# Patient Record
Sex: Female | Born: 1945 | Race: White | Hispanic: No | Smoking: Never smoker
Health system: Southern US, Community
[De-identification: ages and names within clinical notes are randomized; demographics above are authoritative.]

## PROBLEM LIST (undated history)

## (undated) DIAGNOSIS — S92909A Unspecified fracture of unspecified foot, initial encounter for closed fracture: Secondary | ICD-10-CM

## (undated) DIAGNOSIS — M545 Low back pain, unspecified: Secondary | ICD-10-CM

## (undated) DIAGNOSIS — R071 Chest pain on breathing: Secondary | ICD-10-CM

## (undated) DIAGNOSIS — H269 Unspecified cataract: Secondary | ICD-10-CM

## (undated) DIAGNOSIS — K219 Gastro-esophageal reflux disease without esophagitis: Secondary | ICD-10-CM

## (undated) DIAGNOSIS — M1990 Unspecified osteoarthritis, unspecified site: Secondary | ICD-10-CM

## (undated) DIAGNOSIS — N3289 Other specified disorders of bladder: Secondary | ICD-10-CM

## (undated) DIAGNOSIS — L659 Nonscarring hair loss, unspecified: Secondary | ICD-10-CM

## (undated) DIAGNOSIS — F4321 Adjustment disorder with depressed mood: Secondary | ICD-10-CM

## (undated) DIAGNOSIS — L719 Rosacea, unspecified: Secondary | ICD-10-CM

## (undated) DIAGNOSIS — M7751 Other enthesopathy of right foot: Secondary | ICD-10-CM

## (undated) DIAGNOSIS — G8929 Other chronic pain: Secondary | ICD-10-CM

## (undated) DIAGNOSIS — J209 Acute bronchitis, unspecified: Secondary | ICD-10-CM

## (undated) DIAGNOSIS — Z8679 Personal history of other diseases of the circulatory system: Secondary | ICD-10-CM

## (undated) DIAGNOSIS — T7840XA Allergy, unspecified, initial encounter: Secondary | ICD-10-CM

## (undated) DIAGNOSIS — I10 Essential (primary) hypertension: Secondary | ICD-10-CM

## (undated) DIAGNOSIS — R51 Headache: Secondary | ICD-10-CM

## (undated) HISTORY — DX: Unspecified cataract: H26.9

## (undated) HISTORY — PX: TUBAL LIGATION: SHX77

## (undated) HISTORY — DX: Low back pain, unspecified: M54.50

## (undated) HISTORY — DX: Adjustment disorder with depressed mood: F43.21

## (undated) HISTORY — PX: CATARACT EXTRACTION, BILATERAL: SHX1313

## (undated) HISTORY — DX: Gastro-esophageal reflux disease without esophagitis: K21.9

## (undated) HISTORY — PX: COLONOSCOPY: SHX174

## (undated) HISTORY — DX: Unspecified fracture of unspecified foot, initial encounter for closed fracture: S92.909A

## (undated) HISTORY — PX: OTHER SURGICAL HISTORY: SHX169

## (undated) HISTORY — DX: Acute bronchitis, unspecified: J20.9

## (undated) HISTORY — PX: FRONTALIS SUSPENSION: SHX1688

## (undated) HISTORY — PX: ABDOMINAL HYSTERECTOMY: SHX81

## (undated) HISTORY — DX: Headache: R51

## (undated) HISTORY — DX: Essential (primary) hypertension: I10

## (undated) HISTORY — DX: Other enthesopathy of right foot and ankle: M77.51

## (undated) HISTORY — DX: Rosacea, unspecified: L71.9

## (undated) HISTORY — DX: Chest pain on breathing: R07.1

## (undated) HISTORY — DX: Personal history of other diseases of the circulatory system: Z86.79

## (undated) HISTORY — PX: JOINT REPLACEMENT: SHX530

## (undated) HISTORY — DX: Allergy, unspecified, initial encounter: T78.40XA

## (undated) HISTORY — DX: Nonscarring hair loss, unspecified: L65.9

## (undated) HISTORY — DX: Other specified disorders of bladder: N32.89

## (undated) HISTORY — PX: EYE SURGERY: SHX253

## (undated) HISTORY — DX: Other chronic pain: G89.29

## (undated) HISTORY — DX: Unspecified osteoarthritis, unspecified site: M19.90

---

## 1998-09-13 ENCOUNTER — Other Ambulatory Visit: Admission: RE | Admit: 1998-09-13 | Discharge: 1998-09-13 | Payer: Self-pay | Admitting: Obstetrics and Gynecology

## 1998-09-15 ENCOUNTER — Ambulatory Visit (HOSPITAL_COMMUNITY): Admission: RE | Admit: 1998-09-15 | Discharge: 1998-09-15 | Payer: Self-pay | Admitting: Gastroenterology

## 2000-07-10 ENCOUNTER — Other Ambulatory Visit: Admission: RE | Admit: 2000-07-10 | Discharge: 2000-07-10 | Payer: Self-pay | Admitting: Obstetrics and Gynecology

## 2001-11-12 ENCOUNTER — Encounter (INDEPENDENT_AMBULATORY_CARE_PROVIDER_SITE_OTHER): Payer: Self-pay | Admitting: Specialist

## 2001-11-12 ENCOUNTER — Ambulatory Visit (HOSPITAL_BASED_OUTPATIENT_CLINIC_OR_DEPARTMENT_OTHER): Admission: RE | Admit: 2001-11-12 | Discharge: 2001-11-12 | Payer: Self-pay | Admitting: Plastic Surgery

## 2004-09-15 ENCOUNTER — Encounter: Payer: Self-pay | Admitting: Internal Medicine

## 2005-09-06 ENCOUNTER — Ambulatory Visit: Payer: Self-pay | Admitting: Internal Medicine

## 2005-09-12 ENCOUNTER — Ambulatory Visit: Payer: Self-pay | Admitting: Internal Medicine

## 2006-03-14 ENCOUNTER — Ambulatory Visit: Payer: Self-pay | Admitting: Internal Medicine

## 2006-05-25 ENCOUNTER — Ambulatory Visit: Payer: Self-pay | Admitting: Internal Medicine

## 2006-10-18 ENCOUNTER — Ambulatory Visit: Payer: Self-pay | Admitting: Internal Medicine

## 2006-10-18 LAB — CONVERTED CEMR LAB
ALT: 45 units/L — ABNORMAL HIGH (ref 0–40)
CO2: 29 meq/L (ref 19–32)
Chloride: 104 meq/L (ref 96–112)
Creatinine, Ser: 0.9 mg/dL (ref 0.4–1.2)
GFR calc non Af Amer: 68 mL/min
Glomerular Filtration Rate, Af Am: 83 mL/min/{1.73_m2}
Glucose, Bld: 100 mg/dL — ABNORMAL HIGH (ref 70–99)
HDL: 54.9 mg/dL (ref >39.0)
LDL Cholesterol: 102 mg/dL — ABNORMAL HIGH (ref 0–99)
MCHC: 33.7 g/dL (ref 30.0–36.0)
MCV: 90.9 fL (ref 78.0–100.0)
Monocytes Absolute: 1 10*3/uL — ABNORMAL HIGH (ref 0.2–0.7)
Monocytes Relative: 10.9 % (ref 3.0–11.0)
Neutro Abs: 3.9 10*3/uL (ref 1.4–7.7)
Platelets: 230 10*3/uL (ref 150–400)
RBC: 4.76 M/uL (ref 3.87–5.11)
Sodium: 140 meq/L (ref 135–145)
Triglyceride fasting, serum: 69 mg/dL (ref 0–149)
VLDL: 14 mg/dL (ref 0–40)

## 2006-10-25 ENCOUNTER — Ambulatory Visit: Payer: Self-pay | Admitting: Internal Medicine

## 2006-12-28 ENCOUNTER — Ambulatory Visit: Payer: Self-pay | Admitting: Gastroenterology

## 2007-07-08 ENCOUNTER — Ambulatory Visit: Payer: Self-pay | Admitting: Internal Medicine

## 2007-07-08 DIAGNOSIS — R519 Headache, unspecified: Secondary | ICD-10-CM | POA: Insufficient documentation

## 2007-07-08 DIAGNOSIS — N3289 Other specified disorders of bladder: Secondary | ICD-10-CM | POA: Insufficient documentation

## 2007-07-08 DIAGNOSIS — R51 Headache: Secondary | ICD-10-CM | POA: Insufficient documentation

## 2007-07-08 DIAGNOSIS — K219 Gastro-esophageal reflux disease without esophagitis: Secondary | ICD-10-CM | POA: Insufficient documentation

## 2007-07-08 DIAGNOSIS — I059 Rheumatic mitral valve disease, unspecified: Secondary | ICD-10-CM | POA: Insufficient documentation

## 2007-07-08 DIAGNOSIS — J309 Allergic rhinitis, unspecified: Secondary | ICD-10-CM | POA: Insufficient documentation

## 2007-07-08 DIAGNOSIS — L719 Rosacea, unspecified: Secondary | ICD-10-CM | POA: Insufficient documentation

## 2007-09-26 ENCOUNTER — Telehealth: Payer: Self-pay | Admitting: *Deleted

## 2007-10-14 ENCOUNTER — Ambulatory Visit: Payer: Self-pay | Admitting: Internal Medicine

## 2008-03-02 ENCOUNTER — Telehealth: Payer: Self-pay | Admitting: Internal Medicine

## 2008-04-07 ENCOUNTER — Ambulatory Visit: Payer: Self-pay | Admitting: Internal Medicine

## 2008-04-07 LAB — CONVERTED CEMR LAB
ALT: 57 units/L — ABNORMAL HIGH (ref 0–35)
AST: 36 units/L (ref 0–37)
Albumin: 4.2 g/dL (ref 3.5–5.2)
Alkaline Phosphatase: 56 units/L (ref 39–117)
BUN: 18 mg/dL (ref 6–23)
Bilirubin, Direct: 0.1 mg/dL (ref 0.0–0.3)
CO2: 30 meq/L (ref 19–32)
Chloride: 106 meq/L (ref 96–112)
Eosinophils Relative: 1.2 % (ref 0.0–5.0)
GFR calc Af Amer: 94 mL/min
Glucose, Bld: 96 mg/dL (ref 70–99)
HCT: 42.7 % (ref 36.0–46.0)
HDL: 53.8 mg/dL (ref >39.0)
Hemoglobin: 14.2 g/dL (ref 12.0–15.0)
LDL Cholesterol: 110 mg/dL — ABNORMAL HIGH (ref 0–99)
Lymphocytes Relative: 43.1 % (ref 12.0–46.0)
Monocytes Absolute: 0.7 10*3/uL (ref 0.1–1.0)
Monocytes Relative: 8.8 % (ref 3.0–12.0)
Nitrite: NEGATIVE
Platelets: 211 10*3/uL (ref 150–400)
Potassium: 4.6 meq/L (ref 3.5–5.1)
Sodium: 142 meq/L (ref 135–145)
Specific Gravity, Urine: 1.025
Total CHOL/HDL Ratio: 3.3
Total Protein: 7.5 g/dL (ref 6.0–8.3)
Urobilinogen, UA: 0.2
WBC Urine, dipstick: NEGATIVE
WBC: 7.6 10*3/uL (ref 4.5–10.5)

## 2008-04-14 ENCOUNTER — Ambulatory Visit: Payer: Self-pay | Admitting: Internal Medicine

## 2008-08-17 ENCOUNTER — Telehealth: Payer: Self-pay | Admitting: Internal Medicine

## 2008-09-08 ENCOUNTER — Telehealth: Payer: Self-pay | Admitting: *Deleted

## 2008-09-11 ENCOUNTER — Encounter: Payer: Self-pay | Admitting: Internal Medicine

## 2008-10-06 ENCOUNTER — Telehealth: Payer: Self-pay | Admitting: Internal Medicine

## 2008-10-14 ENCOUNTER — Ambulatory Visit: Payer: Self-pay | Admitting: Internal Medicine

## 2008-10-14 ENCOUNTER — Ambulatory Visit: Payer: Self-pay | Admitting: Gastroenterology

## 2008-10-14 LAB — CONVERTED CEMR LAB
ALT: 37 units/L — ABNORMAL HIGH (ref 0–35)
AST: 33 units/L (ref 0–37)
Basophils Absolute: 0.1 10*3/uL (ref 0.0–0.1)
Bilirubin, Direct: 0.1 mg/dL (ref 0.0–0.3)
CO2: 31 meq/L (ref 19–32)
Chloride: 106 meq/L (ref 96–112)
Creatinine, Ser: 0.8 mg/dL (ref 0.4–1.2)
Eosinophils Absolute: 0.1 10*3/uL (ref 0.0–0.7)
GFR calc non Af Amer: 78 mL/min
Lymphocytes Relative: 42 % (ref 12.0–46.0)
MCHC: 34.6 g/dL (ref 30.0–36.0)
MCV: 91.8 fL (ref 78.0–100.0)
Neutrophils Relative %: 46.5 % (ref 43.0–77.0)
Platelets: 184 10*3/uL (ref 150–400)
RDW: 12.8 % (ref 11.5–14.6)
Sodium: 145 meq/L (ref 135–145)
TSH: 2.69 microintl units/mL (ref 0.35–5.50)
Total Bilirubin: 1 mg/dL (ref 0.3–1.2)

## 2008-10-15 ENCOUNTER — Telehealth: Payer: Self-pay | Admitting: Gastroenterology

## 2008-10-21 ENCOUNTER — Telehealth: Payer: Self-pay | Admitting: Internal Medicine

## 2008-10-27 ENCOUNTER — Ambulatory Visit: Payer: Self-pay | Admitting: Gastroenterology

## 2008-10-27 LAB — HM COLONOSCOPY

## 2008-11-27 ENCOUNTER — Ambulatory Visit: Payer: Self-pay | Admitting: Internal Medicine

## 2009-01-15 ENCOUNTER — Telehealth: Payer: Self-pay | Admitting: Internal Medicine

## 2009-02-05 ENCOUNTER — Ambulatory Visit: Payer: Self-pay | Admitting: Internal Medicine

## 2009-02-05 ENCOUNTER — Inpatient Hospital Stay (HOSPITAL_COMMUNITY): Admission: AD | Admit: 2009-02-05 | Discharge: 2009-02-09 | Payer: Self-pay | Admitting: Internal Medicine

## 2009-02-05 ENCOUNTER — Ambulatory Visit: Payer: Self-pay | Admitting: Cardiovascular Disease

## 2009-02-05 DIAGNOSIS — J209 Acute bronchitis, unspecified: Secondary | ICD-10-CM

## 2009-02-08 ENCOUNTER — Encounter: Payer: Self-pay | Admitting: Internal Medicine

## 2009-02-08 ENCOUNTER — Ambulatory Visit: Payer: Self-pay | Admitting: Cardiology

## 2009-02-09 ENCOUNTER — Other Ambulatory Visit: Payer: Self-pay | Admitting: Cardiology

## 2009-02-10 ENCOUNTER — Telehealth: Payer: Self-pay | Admitting: Internal Medicine

## 2009-02-17 ENCOUNTER — Ambulatory Visit: Payer: Self-pay | Admitting: Cardiology

## 2009-02-17 ENCOUNTER — Ambulatory Visit: Payer: Self-pay | Admitting: Internal Medicine

## 2009-02-17 ENCOUNTER — Encounter: Payer: Self-pay | Admitting: Physician Assistant

## 2009-03-18 ENCOUNTER — Telehealth: Payer: Self-pay | Admitting: Internal Medicine

## 2009-05-21 ENCOUNTER — Ambulatory Visit: Payer: Self-pay | Admitting: Internal Medicine

## 2009-07-05 ENCOUNTER — Encounter: Payer: Self-pay | Admitting: Internal Medicine

## 2009-07-05 ENCOUNTER — Telehealth: Payer: Self-pay | Admitting: Internal Medicine

## 2009-07-07 ENCOUNTER — Ambulatory Visit: Payer: Self-pay | Admitting: Internal Medicine

## 2009-07-08 ENCOUNTER — Telehealth: Payer: Self-pay | Admitting: Cardiovascular Disease

## 2009-07-08 ENCOUNTER — Ambulatory Visit: Payer: Self-pay | Admitting: Cardiology

## 2009-07-08 DIAGNOSIS — R002 Palpitations: Secondary | ICD-10-CM | POA: Insufficient documentation

## 2009-07-09 ENCOUNTER — Ambulatory Visit: Payer: Self-pay | Admitting: Internal Medicine

## 2009-07-14 ENCOUNTER — Telehealth (INDEPENDENT_AMBULATORY_CARE_PROVIDER_SITE_OTHER): Payer: Self-pay

## 2009-07-15 ENCOUNTER — Encounter: Payer: Self-pay | Admitting: Internal Medicine

## 2009-07-15 ENCOUNTER — Ambulatory Visit: Payer: Self-pay

## 2009-07-15 ENCOUNTER — Encounter: Payer: Self-pay | Admitting: Cardiology

## 2009-07-16 ENCOUNTER — Telehealth (INDEPENDENT_AMBULATORY_CARE_PROVIDER_SITE_OTHER): Payer: Self-pay | Admitting: *Deleted

## 2009-08-06 ENCOUNTER — Ambulatory Visit: Payer: Self-pay | Admitting: Internal Medicine

## 2009-08-06 ENCOUNTER — Encounter (INDEPENDENT_AMBULATORY_CARE_PROVIDER_SITE_OTHER): Payer: Self-pay | Admitting: *Deleted

## 2009-08-06 DIAGNOSIS — R0989 Other specified symptoms and signs involving the circulatory and respiratory systems: Secondary | ICD-10-CM | POA: Insufficient documentation

## 2009-08-06 LAB — CONVERTED CEMR LAB
AST: 23 units/L (ref 0–37)
Albumin: 4.1 g/dL (ref 3.5–5.2)
Alkaline Phosphatase: 48 units/L (ref 39–117)
Basophils Absolute: 0 10*3/uL (ref 0.0–0.1)
Calcium: 9.4 mg/dL (ref 8.4–10.5)
Cholesterol: 187 mg/dL (ref 0–200)
Eosinophils Absolute: 0.1 10*3/uL (ref 0.0–0.7)
GFR calc non Af Amer: 67.59 mL/min (ref >60)
Glucose, Bld: 93 mg/dL (ref 70–99)
HCT: 38.4 % (ref 36.0–46.0)
HDL: 52.2 mg/dL (ref >39.00)
Hemoglobin, Urine: NEGATIVE
LDL Cholesterol: 118 mg/dL — ABNORMAL HIGH (ref 0–99)
Lymphs Abs: 2.8 10*3/uL (ref 0.7–4.0)
MCV: 92.6 fL (ref 78.0–100.0)
Monocytes Absolute: 0.6 10*3/uL (ref 0.1–1.0)
Neutrophils Relative %: 51.9 % (ref 43.0–77.0)
Platelets: 199 10*3/uL (ref 150.0–400.0)
Potassium: 4.2 meq/L (ref 3.5–5.1)
RDW: 12.8 % (ref 11.5–14.6)
Sodium: 143 meq/L (ref 135–145)
TSH: 3.6 microintl units/mL (ref 0.35–5.50)
Total CHOL/HDL Ratio: 4
Total Protein: 7.3 g/dL (ref 6.0–8.3)
Triglycerides: 83 mg/dL (ref 0.0–149.0)
Urine Glucose: NEGATIVE mg/dL
Urobilinogen, UA: 1 (ref 0.0–1.0)
aPTT: 25.7 s (ref 21.7–28.8)

## 2009-08-11 ENCOUNTER — Ambulatory Visit (HOSPITAL_COMMUNITY): Admission: RE | Admit: 2009-08-11 | Discharge: 2009-08-12 | Payer: Self-pay | Admitting: Internal Medicine

## 2009-08-11 ENCOUNTER — Telehealth (INDEPENDENT_AMBULATORY_CARE_PROVIDER_SITE_OTHER): Payer: Self-pay | Admitting: *Deleted

## 2009-08-11 ENCOUNTER — Ambulatory Visit: Payer: Self-pay | Admitting: Internal Medicine

## 2009-08-12 ENCOUNTER — Encounter: Payer: Self-pay | Admitting: Internal Medicine

## 2009-08-25 ENCOUNTER — Ambulatory Visit: Payer: Self-pay | Admitting: Internal Medicine

## 2009-09-08 ENCOUNTER — Ambulatory Visit: Payer: Self-pay

## 2009-09-08 ENCOUNTER — Ambulatory Visit: Payer: Self-pay | Admitting: Internal Medicine

## 2009-10-20 ENCOUNTER — Telehealth: Payer: Self-pay | Admitting: Internal Medicine

## 2009-11-03 ENCOUNTER — Telehealth: Payer: Self-pay | Admitting: Internal Medicine

## 2009-11-24 ENCOUNTER — Encounter (INDEPENDENT_AMBULATORY_CARE_PROVIDER_SITE_OTHER): Payer: Self-pay | Admitting: *Deleted

## 2009-11-24 ENCOUNTER — Ambulatory Visit: Payer: Self-pay | Admitting: Internal Medicine

## 2010-02-22 ENCOUNTER — Ambulatory Visit: Payer: Self-pay | Admitting: Internal Medicine

## 2010-04-22 ENCOUNTER — Ambulatory Visit: Payer: Self-pay | Admitting: Internal Medicine

## 2010-05-27 ENCOUNTER — Ambulatory Visit: Payer: Self-pay | Admitting: Internal Medicine

## 2010-06-24 ENCOUNTER — Telehealth: Payer: Self-pay | Admitting: Internal Medicine

## 2010-09-30 ENCOUNTER — Ambulatory Visit: Payer: Self-pay | Admitting: Internal Medicine

## 2010-12-01 ENCOUNTER — Encounter: Payer: Self-pay | Admitting: Internal Medicine

## 2011-01-22 LAB — CONVERTED CEMR LAB: Cortisol, Plasma: 11.7 ug/dL

## 2011-01-26 NOTE — Assessment & Plan Note (Signed)
Summary: 3 month rov/njr   Vital Signs:  Patient profile:   65 year old female Height:      66 inches Weight:      146 pounds BMI:     23.65 Temp:     98.2 degrees F oral Pulse rate:   72 / minute Resp:     14 per minute BP sitting:   120 / 70  (left arm)  Vitals Entered By: Willy Eddy, LPN (May 27, 9146 11:27 AM) CC: roa-states she feels great!   Primary Care Provider:  [name] MD  CC:  roa-states she feels great!.  History of Present Illness: [place]! feels well. bladder responded to 2.5 mg vesicare gerd stable mood doing well and discussed pln forholiday from drug   Preventive Screening-Counseling & Management  Alcohol-Tobacco     Smoking Status: never  Problems Prior to Update: 1)  Abnormal Chest Sounds  (ICD-786.7) 2)  Atrial Fibrillation  (ICD-427.31) 3)  Paroxysmal Supraventricular Tachycardia  (ICD-427.0) 4)  Chest Wall Pain, Anterior  (ICD-786.52) 5)  Hypotension, Orthostatic  (ICD-458.0) 6)  Palpitations  (ICD-785.1) 7)  Mitral Valve Prolapse  (ICD-424.0) 8)  Bronchitis, Acute With Mild Bronchospasm  (ICD-466.0) 9)  Grief Reaction, Acute  (ICD-309.0) 10)  Preventive Health Care  (ICD-V70.0) 11)  Bladder Spasm  (ICD-596.8) 12)  Gerd  (ICD-530.81) 13)  Headache  (ICD-784.0) 14)  Rosacea  (ICD-695.3) 15)  Allergic Rhinitis  (ICD-477.9)  Current Problems (verified): 1)  Abnormal Chest Sounds  (ICD-786.7) 2)  Atrial Fibrillation  (ICD-427.31) 3)  Paroxysmal Supraventricular Tachycardia  (ICD-427.0) 4)  Chest Wall Pain, Anterior  (ICD-786.52) 5)  Hypotension, Orthostatic  (ICD-458.0) 6)  Palpitations  (ICD-785.1) 7)  Mitral Valve Prolapse  (ICD-424.0) 8)  Bronchitis, Acute With Mild Bronchospasm  (ICD-466.0) 9)  Grief Reaction, Acute  (ICD-309.0) 10)  Preventive Health Care  (ICD-V70.0) 11)  Bladder Spasm  (ICD-596.8) 12)  Gerd  (ICD-530.81) 13)  Headache  (ICD-784.0) 14)   Rosacea  (ICD-695.3) 15)  Allergic Rhinitis  (ICD-477.9)  Medications Prior to Update: 1)  Lexapro 10 Mg Tabs (Escitalopram Oxalate) .... 1/2 By Mouth Daily 2)  Xanax 0.5 Mg Tabs (Alprazolam) .... As Needed 3)  Vesicare 5 Mg Tabs (Solifenacin Succinate) .... One By Mouth Daily  Current Medications (verified): 1)  Lexapro 10 Mg Tabs (Escitalopram Oxalate) .... 1/2 By Mouth Daily 2)  Xanax 0.5 Mg Tabs (Alprazolam) .... As Needed 3)  Vesicare 5 Mg Tabs (Solifenacin Succinate) .... 1/2 By Mouth Daily  Allergies (verified): 1)  ! Reglan (Metoclopramide Hcl) 2)  ! Amoxicillin  Past History:  Family History: Last updated: October 26, 2007 father died at 42 cancer mother diesd at 29 of CHF  Social History: Last updated: 07/09/2009 Married widowed 8/09 Never Smoked Alcohol use-no Drug use-no  Risk Factors: Smoking Status: never (05/27/2010)  Past medical, surgical, family and social histories (including risk factors) reviewed, and no changes noted (except as noted below).  Past Medical History: Reviewed history from 07/09/2009 and no changes required. Current Problems:  CHEST WALL PAIN, ANTERIOR (ICD-786.52) BRONCHITIS, ACUTE WITH MILD BRONCHOSPASM (ICD-466.0) GRIEF REACTION, ACUTE (ICD-309.0) BLADDER SPASM (ICD-596.8) GERD (ICD-530.81) HEADACHE (ICD-784.0) ROSACEA (ICD-695.3) ALLERGIC RHINITIS (ICD-477.9) hx of Mitral valve prolapse, not supproted by echo  Past Surgical History: Reviewed history from 07/08/2009 and no changes required. Hysterectomy Hand tendon repair oin left hand foot surgery twice Tubal ligation  Family History: Reviewed history from 2007-10-26  and no changes required. father died at 50 cancer mother diesd at 53 of CHF  Social History: Reviewed history from 07/09/2009 and no changes required. Married widowed 8/09 Never Smoked Alcohol use-no Drug use-no  Review of Systems  The patient denies anorexia, fever, weight loss, weight gain, vision  loss, decreased hearing, hoarseness, chest pain, syncope, dyspnea on exertion, peripheral edema, prolonged cough, headaches, hemoptysis, abdominal pain, melena, hematochezia, severe indigestion/heartburn, hematuria, incontinence, genital sores, muscle weakness, suspicious skin lesions, transient blindness, difficulty walking, depression, unusual weight change, abnormal bleeding, enlarged lymph nodes, angioedema, and breast masses.    Physical Exam  General:  alert, underweight appearing, and pale.   Head:  normocephalic and atraumatic.   Eyes:  pupils equal and pupils round.   Ears:  R ear normal and L ear normal.   Nose:  no external deformity and no nasal discharge.   Mouth:  Oral mucosa and oropharynx without lesions or exudates.  Teeth in good repair. Neck:  No deformities, masses, or tenderness noted. Lungs:  normal respiratory effort and no wheezes.   Heart:  normal rate and regular rhythm.   Abdomen:  soft and non-tender.   Msk:  normal ROM and no joint tenderness.   Pulses:  R and L carotid,radial,femoral,dorsalis pedis and posterior tibial pulses are full and equal bilaterally Extremities:  No clubbing, cyanosis, edema, or deformity noted with normal full range of motion of all joints.   Neurologic:  No cranial nerve deficits noted. Station and gait are normal. Plantar reflexes are down-going bilaterally. DTRs are symmetrical throughout. Sensory, motor and coordinative functions appear intact.   Impression & Recommendations:  Problem # 1:  PALPITATIONS (ICD-785.1)  stable with out recurrance  Avoid caffeine, chocolate, and stimulants. Stress reduction as well as medication options discussed.   Problem # 2:  GRIEF REACTION, ACUTE (ICD-309.0) may consider stopping  August  Problem # 3:  BLADDER SPASM (ICD-596.8) on 1/2 vesicar  Problem # 4:  ALLERGIC RHINITIS (ICD-477.9) [place] the [place] Discussed use of allergy medications and environmental measures.   Complete  Medication List: 1)  Lexapro 10 Mg Tabs (Escitalopram oxalate) .... 1/2 by mouth daily 2)  Xanax 0.5 Mg Tabs (Alprazolam) .... As needed 3)  Vesicare 5 Mg Tabs (Solifenacin succinate) .... 1/2 by mouth daily  Patient Instructions: 1)  Please schedule a follow-up appointment in 4 months.

## 2011-01-26 NOTE — Letter (Signed)
Summary: [facility]  [facility]   Imported By: Maryln Gottron 12/07/2010 15:34:15  _____________________________________________________________________  External Attachment:    Type:   Image     Comment:   External Document

## 2011-01-26 NOTE — Assessment & Plan Note (Signed)
Summary: f68m/jml   Primary Provider:  [name] MD  CC:  6 month follow up.   pt states she has had no cardiac concerns or symptoms since last visit.  History of Present Illness: 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   She has had no recurrent sustained tachycardia palpitations. She did have one scare where her not to get a grandchild from a car her heart race but it immediately slowed down.The patient denies SOB, chest pain, edema or palpitations  She had tachycardia palpitations following her procedure. Recorder was utilized which reviewed and demonstrated only sinus rhythm.     Current Medications (verified): 1)  Lexapro 10 Mg Tabs (Escitalopram Oxalate) .... 1/2 By Mouth Daily 2)  Xanax 0.5 Mg Tabs (Alprazolam) .... As Needed 3)  Vesicare 5 Mg Tabs (Solifenacin Succinate) .... One By Mouth Daily  Allergies (verified): 1)  ! Reglan (Metoclopramide Hcl) 2)  ! Amoxicillin  Past History:  Past Medical History: Last updated: 07/09/2009 Current Problems:  CHEST WALL PAIN, ANTERIOR (ICD-786.52) BRONCHITIS, ACUTE WITH MILD BRONCHOSPASM (ICD-466.0) GRIEF REACTION, ACUTE (ICD-309.0) BLADDER SPASM (ICD-596.8) GERD (ICD-530.81) HEADACHE (ICD-784.0) ROSACEA (ICD-695.3) ALLERGIC RHINITIS (ICD-477.9) hx of Mitral valve prolapse, not supproted by echo  Past Surgical History: Last updated: 07/08/2009 Hysterectomy Hand tendon repair oin left hand foot surgery twice Tubal ligation  Family History: Last updated: 10-15-07 father died at 76 cancer mother diesd at 47 of CHF  Social History: Last updated: 07/09/2009 Married widowed 8/09 Never Smoked Alcohol use-no Drug use-no  Vital Signs:  Patient profile:   65 year old female Height:      66 inches Weight:      146 pounds BMI:     23.65 Pulse rate:   60 / minute Pulse rhythm:   regular BP sitting:   126 / 82  (left arm) Cuff size:   regular  Vitals Entered By:  Judithe Modest CMA (April 22, 2010 10:07 AM)  Physical Exam  General:  The patient was alert and oriented in no acute distress. HEENT Normal.  Neck veins were flat, carotids were brisk.  Lungs were clear.  Heart sounds were regular without murmurs or gallops.  Abdomen was soft with active bowel sounds. There is no clubbing cyanosis or edema. Skin Warm and dry    EKG  Procedure date:  04/22/2010  Findings:      Sinus rhythm at 60 Intervals 0.13007/0.43 Otherwise  Impression & Recommendations:  Problem # 1:  PAROXYSMAL SUPRAVENTRICULAR TACHYCARDIA (ICD-427.0) No recurrent SVT  :  event recorder demonstrated only sinus rhythm.  Other Orders: EKG w/ Interpretation (93000)  Patient Instructions: 1)  Your physician recommends that you schedule a follow-up appointment in: as needed.

## 2011-01-26 NOTE — Assessment & Plan Note (Signed)
Summary: 3 month rov/njr   Vital Signs:  Patient profile:   65 year old female Height:      66 inches Weight:      146 pounds BMI:     23.65 Temp:     98.1 degrees F oral Pulse rate:   72 / minute Resp:     14 per minute BP sitting:   120 / 80  (left arm)  Vitals Entered By: Willy Eddy, LPN (February 23, 7828 11:18 AM) CC: roa- states she feels great after staying at [facility] for t he winter, Abdominal Pain   Primary Care Provider:  [name] MD  CC:  roa- states she feels great after staying at [facility] for t he winter and Abdominal Pain.  History of Present Illness: Pt feels good... has been in [place] for the winter has been considering selling house in  [place] HA pattern stable with out any changees in intesity and frequency GERD stable on meds reveiewd the use of lexapro  Dyspepsia History:      There is a prior history of GERD.     Preventive Screening-Counseling & Management  Alcohol-Tobacco     Smoking Status: never  Problems Prior to Update: 1)  Abnormal Chest Sounds  (ICD-786.7) 2)  Atrial Fibrillation  (ICD-427.31) 3)  Paroxysmal Supraventricular Tachycardia  (ICD-427.0) 4)  Chest Wall Pain, Anterior  (ICD-786.52) 5)  Hypotension, Orthostatic  (ICD-458.0) 6)  Palpitations  (ICD-785.1) 7)  Mitral Valve Prolapse  (ICD-424.0) 8)  Bronchitis, Acute With Mild Bronchospasm  (ICD-466.0) 9)  Grief Reaction, Acute  (ICD-309.0) 10)  Preventive Health Care  (ICD-V70.0) 11)  Bladder Spasm  (ICD-596.8) 12)  Gerd  (ICD-530.81) 13)  Headache  (ICD-784.0) 14)  Rosacea  (ICD-695.3) 15)  Allergic Rhinitis  (ICD-477.9)  Current Problems (verified): 1)  Abnormal Chest Sounds  (ICD-786.7) 2)  Atrial Fibrillation  (ICD-427.31) 3)  Paroxysmal Supraventricular Tachycardia  (ICD-427.0) 4)  Chest Wall Pain, Anterior  (ICD-786.52) 5)  Hypotension, Orthostatic  (ICD-458.0) 6)  Palpitations  (ICD-785.1) 7)  Mitral Valve Prolapse  (ICD-424.0) 8)  Bronchitis, Acute  With Mild Bronchospasm  (ICD-466.0) 9)  Grief Reaction, Acute  (ICD-309.0) 10)  Preventive Health Care  (ICD-V70.0) 11)  Bladder Spasm  (ICD-596.8) 12)  Gerd  (ICD-530.81) 13)  Headache  (ICD-784.0) 14)  Rosacea  (ICD-695.3) 15)  Allergic Rhinitis  (ICD-477.9)  Medications Prior to Update: 1)  Lexapro 10 Mg Tabs (Escitalopram Oxalate) .Marland Kitchen.. 1 Once Daily 2)  Xanax 0.5 Mg Tabs (Alprazolam) .... As Needed 3)  Enablex 15 Mg Xr24h-Tab (Darifenacin Hydrobromide) .... One By Mouth Daily  Current Medications (verified): 1)  Lexapro 10 Mg Tabs (Escitalopram Oxalate) .... 1/2 By Mouth Daily 2)  Xanax 0.5 Mg Tabs (Alprazolam) .... As Needed 3)  Vesicare 5 Mg Tabs (Solifenacin Succinate) .... One By Mouth Daily  Allergies (verified): 1)  ! Reglan (Metoclopramide Hcl) 2)  ! Amoxicillin  Past History:  Family History: Last updated: 10-22-07 father died at 83 cancer mother diesd at 95 of CHF  Social History: Last updated: 07/09/2009 Married widowed 8/09 Never Smoked Alcohol use-no Drug use-no  Risk Factors: Smoking Status: never (02/22/2010)  Past medical, surgical, family and social histories (including risk factors) reviewed, and no changes noted (except as noted below).  Past Medical History: Reviewed history from 07/09/2009 and no changes required. Current Problems:  CHEST WALL PAIN, ANTERIOR (ICD-786.52) BRONCHITIS, ACUTE WITH MILD BRONCHOSPASM (ICD-466.0) GRIEF REACTION, ACUTE (ICD-309.0) BLADDER SPASM (ICD-596.8) GERD (  ICD-530.81) HEADACHE (ICD-784.0) ROSACEA (ICD-695.3) ALLERGIC RHINITIS (ICD-477.9) hx of Mitral valve prolapse, not supproted by echo  Past Surgical History: Reviewed history from 07/08/2009 and no changes required. Hysterectomy Hand tendon repair oin left hand foot surgery twice Tubal ligation  Family History: Reviewed history from 10/14/2007 and no changes required. father died at 73 cancer mother diesd at 46 of CHF  Social  History: Reviewed history from 07/09/2009 and no changes required. Married widowed 8/09 Never Smoked Alcohol use-no Drug use-no  Review of Systems  The patient denies anorexia, fever, weight loss, weight gain, vision loss, decreased hearing, hoarseness, chest pain, syncope, dyspnea on exertion, peripheral edema, prolonged cough, headaches, hemoptysis, abdominal pain, melena, hematochezia, severe indigestion/heartburn, hematuria, incontinence, genital sores, muscle weakness, suspicious skin lesions, transient blindness, difficulty walking, depression, unusual weight change, abnormal bleeding, enlarged lymph nodes, angioedema, and breast masses.    Physical Exam  General:  alert, underweight appearing, and pale.   Head:  normocephalic and atraumatic.   Eyes:  pupils equal and pupils round.   Ears:  R ear normal and L ear normal.   Nose:  no external deformity and no nasal discharge.   Mouth:  Oral mucosa and oropharynx without lesions or exudates.  Teeth in good repair. Neck:  No deformities, masses, or tenderness noted. Lungs:  normal respiratory effort and no wheezes.   Heart:  normal rate and regular rhythm.   Abdomen:  soft and non-tender.   Msk:  normal ROM and no joint tenderness.   Pulses:  R and L carotid,radial,femoral,dorsalis pedis and posterior tibial pulses are full and equal bilaterally Extremities:  No clubbing, cyanosis, edema, or deformity noted with normal full range of motion of all joints.   Neurologic:  No cranial nerve deficits noted. Station and gait are normal. Plantar reflexes are down-going bilaterally. DTRs are symmetrical throughout. Sensory, motor and coordinative functions appear intact. Skin:  color normal, no rashes, and decreased turgor.   Cervical Nodes:  No lymphadenopathy noted Axillary Nodes:  No palpable lymphadenopathy Psych:  Oriented X3.     Impression & Recommendations:  Problem # 1:  ATRIAL FIBRILLATION (ICD-427.31) Assessment  Improved stable rate no recurrent AF Reviewed the following: PT: 10.0 (08/06/2009)   INR: 1.0 ratio (08/06/2009)  Problem # 2:  PALPITATIONS (ICD-785.1) Assessment: Improved stable/improved  Problem # 3:  GERD (ICD-530.81) stable on OTC prn Labs Reviewed: Hgb: 13.0 (08/06/2009)   Hct: 38.4 (08/06/2009)  Problem # 4:  BLADDER SPASM (ICD-596.8) enablix... has been weanign but has increased incontinace  chang eto vesicar due to fomulary  Problem # 5:  GRIEF REACTION, ACUTE (ICD-309.0) resolved  Complete Medication List: 1)  Lexapro 10 Mg Tabs (Escitalopram oxalate) .... 1/2 by mouth daily 2)  Xanax 0.5 Mg Tabs (Alprazolam) .... As needed 3)  Vesicare 5 Mg Tabs (Solifenacin succinate) .... One by mouth daily  Patient Instructions: 1)  cut the lexpro to 1/2 a day 2)  Please schedule a follow-up appointment in 3 months. Prescriptions: VESICARE 5 MG TABS (SOLIFENACIN SUCCINATE) one by mouth daily  #30 x 11   Entered and Authorized by:   [name] MD   Signed by:   [name] MD on 02/22/2010   Method used:   Electronically to        [facility] (retail)       [address], [place]  [address]       Ph: [phone]  Fax: [phone]   RxID:   [id number]

## 2011-01-26 NOTE — Assessment & Plan Note (Signed)
Summary: 4 month rov/njr   Vital Signs:  Patient profile:   65 year old female Height:      66 inches Weight:      154 pounds BMI:     24.95 Temp:     98.2 degrees F oral Pulse rate:   72 / minute Resp:     14 per minute BP sitting:   130 / 80  (left arm)  Vitals Entered By: Willy Eddy, LPN (September 30, 2010 11:24 AM) CC: roa- pt in good mood and states she feels good, Headache, Hypertension Management Is Patient Diabetic? No   Primary Care Rahiem Schellinger:  [name] MD  CC:  roa- pt in good mood and states she feels good, Headache, and Hypertension Management.  History of Present Illness: Living  with family and doing well ready to stop antidepresant started with husband died unexpectedly decreased head ache pattern frequency with accidents persisit best treated with enablex but change to vesicare due to formulary  Headache HPI:      The patient comes in for chronic management of stable headaches.  Since the last visit, the frequency of headaches have decreased, and the intensity of the headaches have decreased.         Hypertension History:      She denies headache, chest pain, palpitations, dyspnea with exertion, orthopnea, PND, peripheral edema, visual symptoms, neurologic problems, syncope, and side effects from treatment.        Positive major cardiovascular risk factors include female age 49 years old or older and hypertension.  Negative major cardiovascular risk factors include non-tobacco-user status.          Preventive Screening-Counseling & Management  Alcohol-Tobacco     Smoking Status: never     Tobacco Counseling: not indicated; no tobacco use  Problems Prior to Update: 1)  Abnormal Chest Sounds  (ICD-786.7) 2)  Atrial Fibrillation  (ICD-427.31) 3)  Paroxysmal Supraventricular Tachycardia  (ICD-427.0) 4)  Chest Wall Pain, Anterior  (ICD-786.52) 5)  Hypotension, Orthostatic  (ICD-458.0) 6)  Palpitations  (ICD-785.1) 7)  Mitral Valve Prolapse   (ICD-424.0) 8)  Bronchitis, Acute With Mild Bronchospasm  (ICD-466.0) 9)  Grief Reaction, Acute  (ICD-309.0) 10)  Preventive Health Care  (ICD-V70.0) 11)  Bladder Spasm  (ICD-596.8) 12)  Gerd  (ICD-530.81) 13)  Headache  (ICD-784.0) 14)  Rosacea  (ICD-695.3) 15)  Allergic Rhinitis  (ICD-477.9)  Current Problems (verified): 1)  Abnormal Chest Sounds  (ICD-786.7) 2)  Atrial Fibrillation  (ICD-427.31) 3)  Paroxysmal Supraventricular Tachycardia  (ICD-427.0) 4)  Chest Wall Pain, Anterior  (ICD-786.52) 5)  Hypotension, Orthostatic  (ICD-458.0) 6)  Palpitations  (ICD-785.1) 7)  Mitral Valve Prolapse  (ICD-424.0) 8)  Bronchitis, Acute With Mild Bronchospasm  (ICD-466.0) 9)  Grief Reaction, Acute  (ICD-309.0) 10)  Preventive Health Care  (ICD-V70.0) 11)  Bladder Spasm  (ICD-596.8) 12)  Gerd  (ICD-530.81) 13)  Headache  (ICD-784.0) 14)  Rosacea  (ICD-695.3) 15)  Allergic Rhinitis  (ICD-477.9)  Medications Prior to Update: 1)  Lexapro 10 Mg Tabs (Escitalopram Oxalate) .... 1/2 By Mouth Daily 2)  Xanax 0.5 Mg Tabs (Alprazolam) .... As Needed 3)  Vesicare 5 Mg Tabs (Solifenacin Succinate) .... 1/2 By Mouth Daily  Current Medications (verified): 1)  Enablex 7.5 Mg Xr24h-Tab (Darifenacin Hydrobromide) .... One By Mouth Daily  Allergies (verified): 1)  ! Reglan (Metoclopramide Hcl) 2)  ! Amoxicillin  Past History:  Past Medical History: Last updated: 07/09/2009 Current Problems:  CHEST WALL PAIN,  ANTERIOR (ICD-786.52) BRONCHITIS, ACUTE WITH MILD BRONCHOSPASM (ICD-466.0) GRIEF REACTION, ACUTE (ICD-309.0) BLADDER SPASM (ICD-596.8) GERD (ICD-530.81) HEADACHE (ICD-784.0) ROSACEA (ICD-695.3) ALLERGIC RHINITIS (ICD-477.9) hx of Mitral valve prolapse, not supproted by echo  Past Surgical History: Last updated: 07/08/2009 Hysterectomy Hand tendon repair oin left hand foot surgery twice Tubal ligation  Family History: Last updated: 11-04-2007 father died at 47 cancer mother  diesd at 73 of CHF  Social History: Last updated: 07/09/2009 Married widowed 8/09 Never Smoked Alcohol use-no Drug use-no  Risk Factors: Smoking Status: never (09/30/2010)  Family History: Reviewed history from 11/04/2007 and no changes required. father died at 77 cancer mother diesd at 34 of CHF  Social History: Reviewed history from 07/09/2009 and no changes required. Married widowed 8/09 Never Smoked Alcohol use-no Drug use-no  Review of Systems  The patient denies anorexia, fever, weight loss, weight gain, vision loss, decreased hearing, hoarseness, chest pain, syncope, dyspnea on exertion, peripheral edema, prolonged cough, headaches, hemoptysis, abdominal pain, melena, hematochezia, severe indigestion/heartburn, hematuria, incontinence, genital sores, muscle weakness, suspicious skin lesions, transient blindness, difficulty walking, depression, unusual weight change, abnormal bleeding, enlarged lymph nodes, angioedema, and breast masses.         Flu Vaccine Consent Questions     Do you have a history of severe allergic reactions to this vaccine? no    Any prior history of allergic reactions to egg and/or gelatin? no    Do you have a sensitivity to the preservative Thimersol? no    Do you have a past history of Guillan-Barre Syndrome? no    Do you currently have an acute febrile illness? no    Have you ever had a severe reaction to latex? no    Vaccine information given and explained to patient? yes    Are you currently pregnant? no    Lot Number:[id number]   Exp Date:06/24/2011   Site Given  Left Deltoid IM   Physical Exam  General:  alert, underweight appearing, and pale.   Head:  normocephalic and atraumatic.   Eyes:  pupils equal and pupils round.   Ears:  R ear normal and L ear normal.   Nose:  no external deformity and no nasal discharge.   Mouth:  Oral mucosa and oropharynx without lesions or exudates.  Teeth in good repair. Neck:  No deformities, masses,  or tenderness noted. Lungs:  normal respiratory effort and no wheezes.   Heart:  normal rate and regular rhythm.   Abdomen:  soft and non-tender.   Msk:  normal ROM and no joint tenderness.   Extremities:  No clubbing, cyanosis, edema, or deformity noted with normal full range of motion of all joints.   Neurologic:  No cranial nerve deficits noted. Station and gait are normal. Plantar reflexes are down-going bilaterally. DTRs are symmetrical throughout. Sensory, motor and coordinative functions appear intact. Skin:  less red skin on face no rash Psych:  Oriented X3 and not depressed appearing.     Impression & Recommendations:  Problem # 1:  MITRAL VALVE PROLAPSE (ICD-424.0) Assessment Unchanged stable less palpitations, no meciation requested Echocardiogram:   Study Conclusions    1. Left ventricle: The cavity size was normal. Wall thickness was      normal. Systolic function was normal. The estimated ejection      fraction was in the range of 55% to 60%. Wall motion was normal;      there were no regional wall motion abnormalities. Doppler      parameters are consistent with  abnormal left ventricular      relaxation (grade 1 diastolic dysfunction).   2. Inferior vena cava: The vessel was normal in size; the      respirophasic diameter changes were in the normal range (= 50%);      findings are consistent with normal central venous pressure.   Impressions:    - Normal LV size and systolic function, EF 55-60%. Normal RV size     and systolic function. No significant valvular dysfunction.   Echocardiography. M-mode, complete 2D, spectral Doppler, and color   Doppler. Height: Height: 167.6cm. Height: 66in. Weight: Weight:   59.4kg. Weight: 130.7lb. Body mass index: BMI: 21.1kg/m^2. Body   surface area: BSA: 1.66m^2. Patient status: Outpatient. Location:   [place] [facility]. (07/15/2009)  Problem # 2:  BLADDER SPASM (ICD-596.8) Assessment: Deteriorated pt has not responded well to  the vesicare...this was a fomulary change willl resume the enablix and do a preauthorization is needed ( also failed detrol)  Problem # 3:  HEADACHE (ICD-784.0)  was on enablix with good result bu failed toviaz and now vesicare  Headache diary reviewed. less frequency  Problem # 4:  ROSACEA (ICD-695.3) improved  Complete Medication List: 1)  Enablex 7.5 Mg Xr24h-tab (Darifenacin hydrobromide) .... One by mouth daily  Other Orders: Admin 1st Vaccine (16109) Flu Vaccine 11yrs + [id number])  Hypertension Assessment/Plan:      The patient's hypertensive risk group is category B: At least one risk factor (excluding diabetes) with no target organ damage.  Her calculated 10 year risk of coronary heart disease is 9 %.  Today's blood pressure is 130/80.  Her blood pressure goal is < 140/90.  Patient Instructions: 1)  Please schedule a follow-up appointment in 6 months.  CPX Prescriptions: ENABLEX 7.5 MG XR24H-TAB (DARIFENACIN HYDROBROMIDE) one by mouth daily  #30 x 11   Entered and Authorized by:   [name] MD   Signed by:   [name] MD on 09/30/2010   Method used:   Print then Give to Patient   RxID:   [id number]

## 2011-01-26 NOTE — Progress Notes (Signed)
Summary: Pt req refill of [facility] to [facility] [place], [place] [address]  Phone Note Refill Request Call back at Home Phone [phone] Message from:  Patient on June 24, 2010 1:50 PM  Refills Requested: Medication #1:  LEXAPRO 10 MG TABS 1/2 by mouth daily   Dosage confirmed as above?Dosage Confirmed Pt has moved to [place] and needs refill called in [facility] [place], [place] [address]  [phone]     Method Requested: Telephone to Pharmacy Initial call taken by: Lucy Antigua,  June 24, 2010 1:52 PM    Prescriptions: LEXAPRO 10 MG TABS (ESCITALOPRAM OXALATE) 1/2 by mouth daily  #30 x 6   Entered by:   Willy Eddy, LPN   Authorized by:   [name] MD   Signed by:   Willy Eddy, LPN on 10/21/2535   Method used:   Telephoned to ...       [facility] (retail)       [address], [place]  [address]       Ph: [phone]       Fax: [phone]   RxID:   [id number]   Appended Document: Pt [facility] [place], [place] [address] [facility] [place]

## 2011-04-07 ENCOUNTER — Encounter: Payer: Self-pay | Admitting: Internal Medicine

## 2011-04-11 LAB — BASIC METABOLIC PANEL
CO2: 29 mEq/L (ref 19–32)
Calcium: 8.4 mg/dL (ref 8.4–10.5)
Chloride: 108 mEq/L (ref 96–112)
Chloride: 103 mEq/L (ref 96–112)
Creatinine, Ser: 0.83 mg/dL (ref 0.4–1.2)
Creatinine, Ser: 0.84 mg/dL (ref 0.4–1.2)
GFR calc Af Amer: >60 mL/min (ref >60)
GFR calc Af Amer: >60 mL/min (ref >60)
Glucose, Bld: 89 mg/dL (ref 70–99)

## 2011-04-11 LAB — CARDIAC PANEL(CRET KIN+CKTOT+MB+TROPI)
CK, MB: 3.1 ng/mL (ref 0.3–4.0)
CK, MB: 3.5 ng/mL (ref 0.3–4.0)
Relative Index: INVALID (ref 0.0–2.5)
Total CK: 57 U/L (ref 7–177)
Total CK: 61 U/L (ref 7–177)
Troponin I: 0.25 ng/mL — ABNORMAL HIGH (ref 0.00–0.06)

## 2011-04-11 LAB — CBC
MCV: 91.2 fL (ref 78.0–100.0)
MCV: 92.4 fL (ref 78.0–100.0)
RBC: 4.39 MIL/uL (ref 3.87–5.11)
RBC: 3.71 MIL/uL — ABNORMAL LOW (ref 3.87–5.11)
WBC: 7.7 10*3/uL (ref 4.0–10.5)
WBC: 5.6 10*3/uL (ref 4.0–10.5)

## 2011-04-11 LAB — TSH: TSH: 2.277 u[IU]/mL (ref 0.350–4.500)

## 2011-04-11 LAB — DIFFERENTIAL
Lymphs Abs: 2.5 10*3/uL (ref 0.7–4.0)
Monocytes Relative: 10 % (ref 3–12)
Neutro Abs: 4.3 10*3/uL (ref 1.7–7.7)
Neutrophils Relative %: 56 % (ref 43–77)

## 2011-04-11 LAB — PROTIME-INR
INR: 1 (ref 0.00–1.49)
Prothrombin Time: 13 seconds (ref 11.6–15.2)

## 2011-04-11 LAB — CORTISOL: Cortisol, Plasma: 19.2 ug/dL

## 2011-04-11 LAB — METANEPHRINES, URINE, 24 HOUR
Metanephrines, Ur: 68
Normetanephrine, 24H Ur: 440

## 2011-05-03 IMAGING — CR DG CHEST 2V
2 series · 2 of 2 positions shown · non-contrast
Comparison: 02/06/2009

CLINICAL DATA: Atrial fibrillation.

CHEST - 2 VIEW

[view not recorded (1 of 2)]
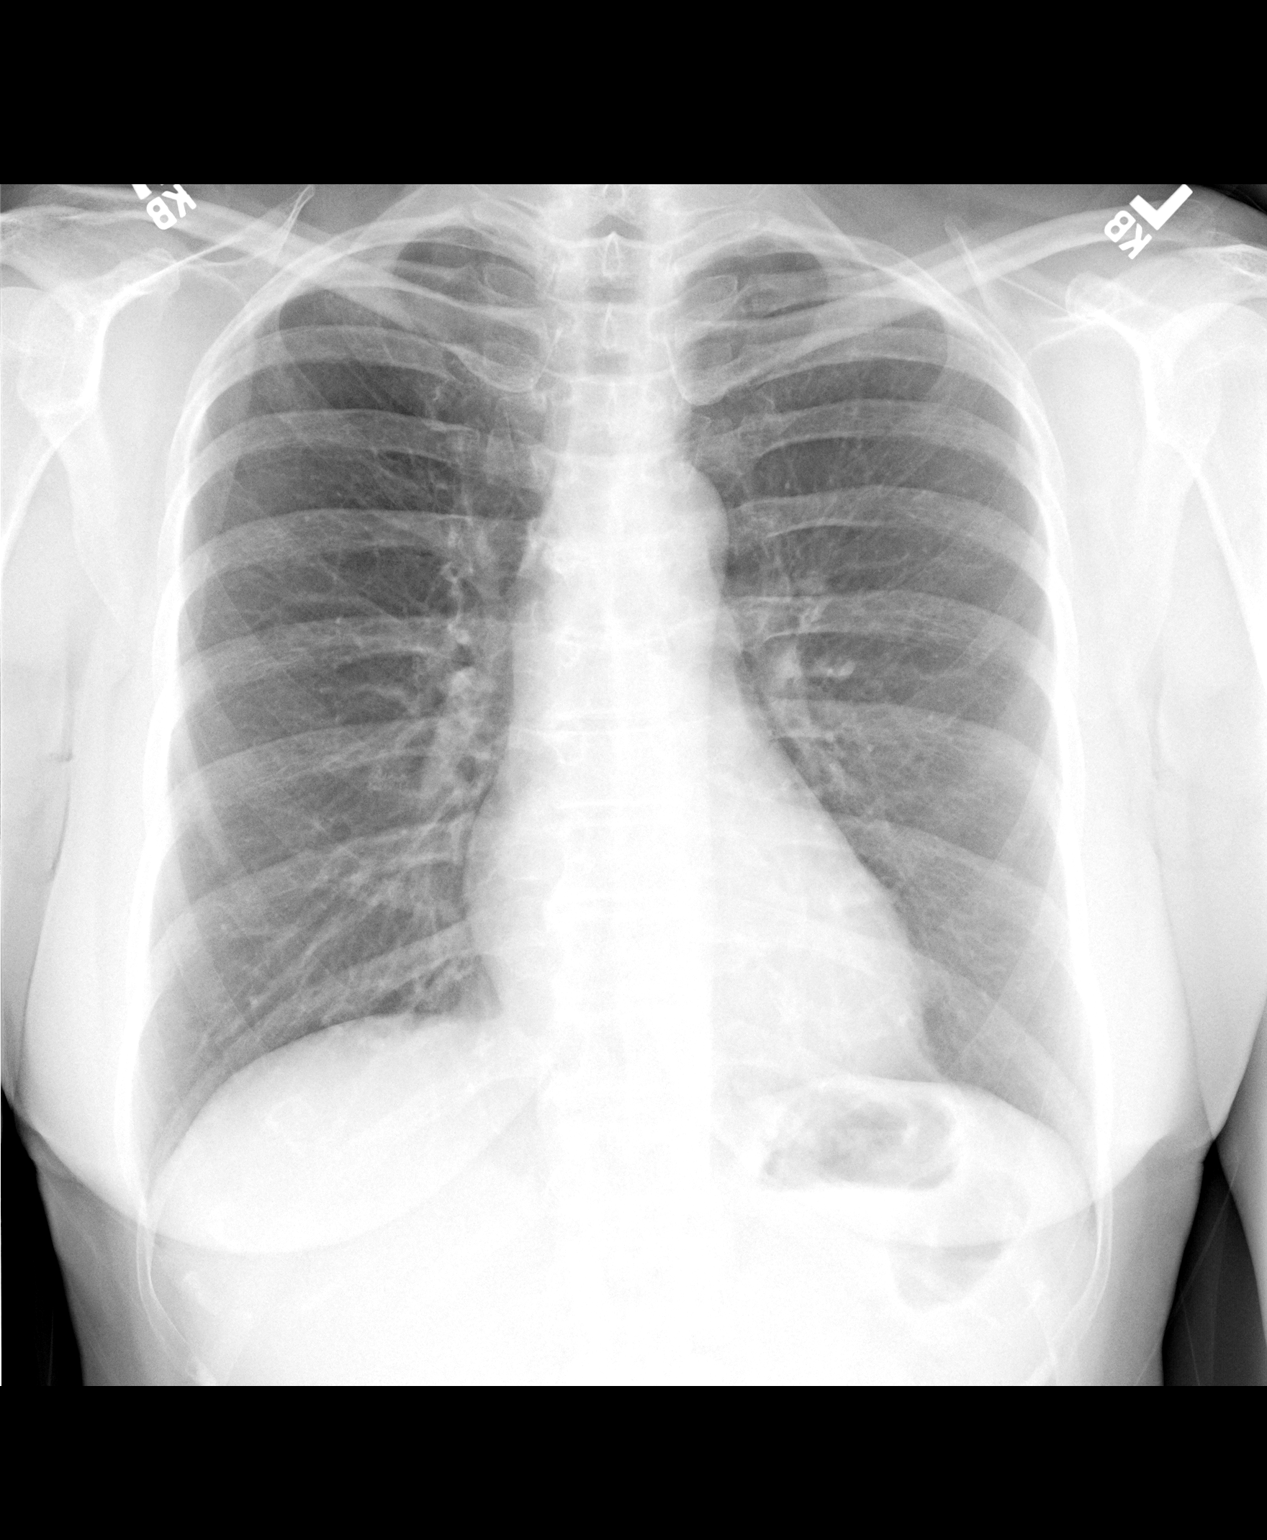

[view not recorded (2 of 2)]
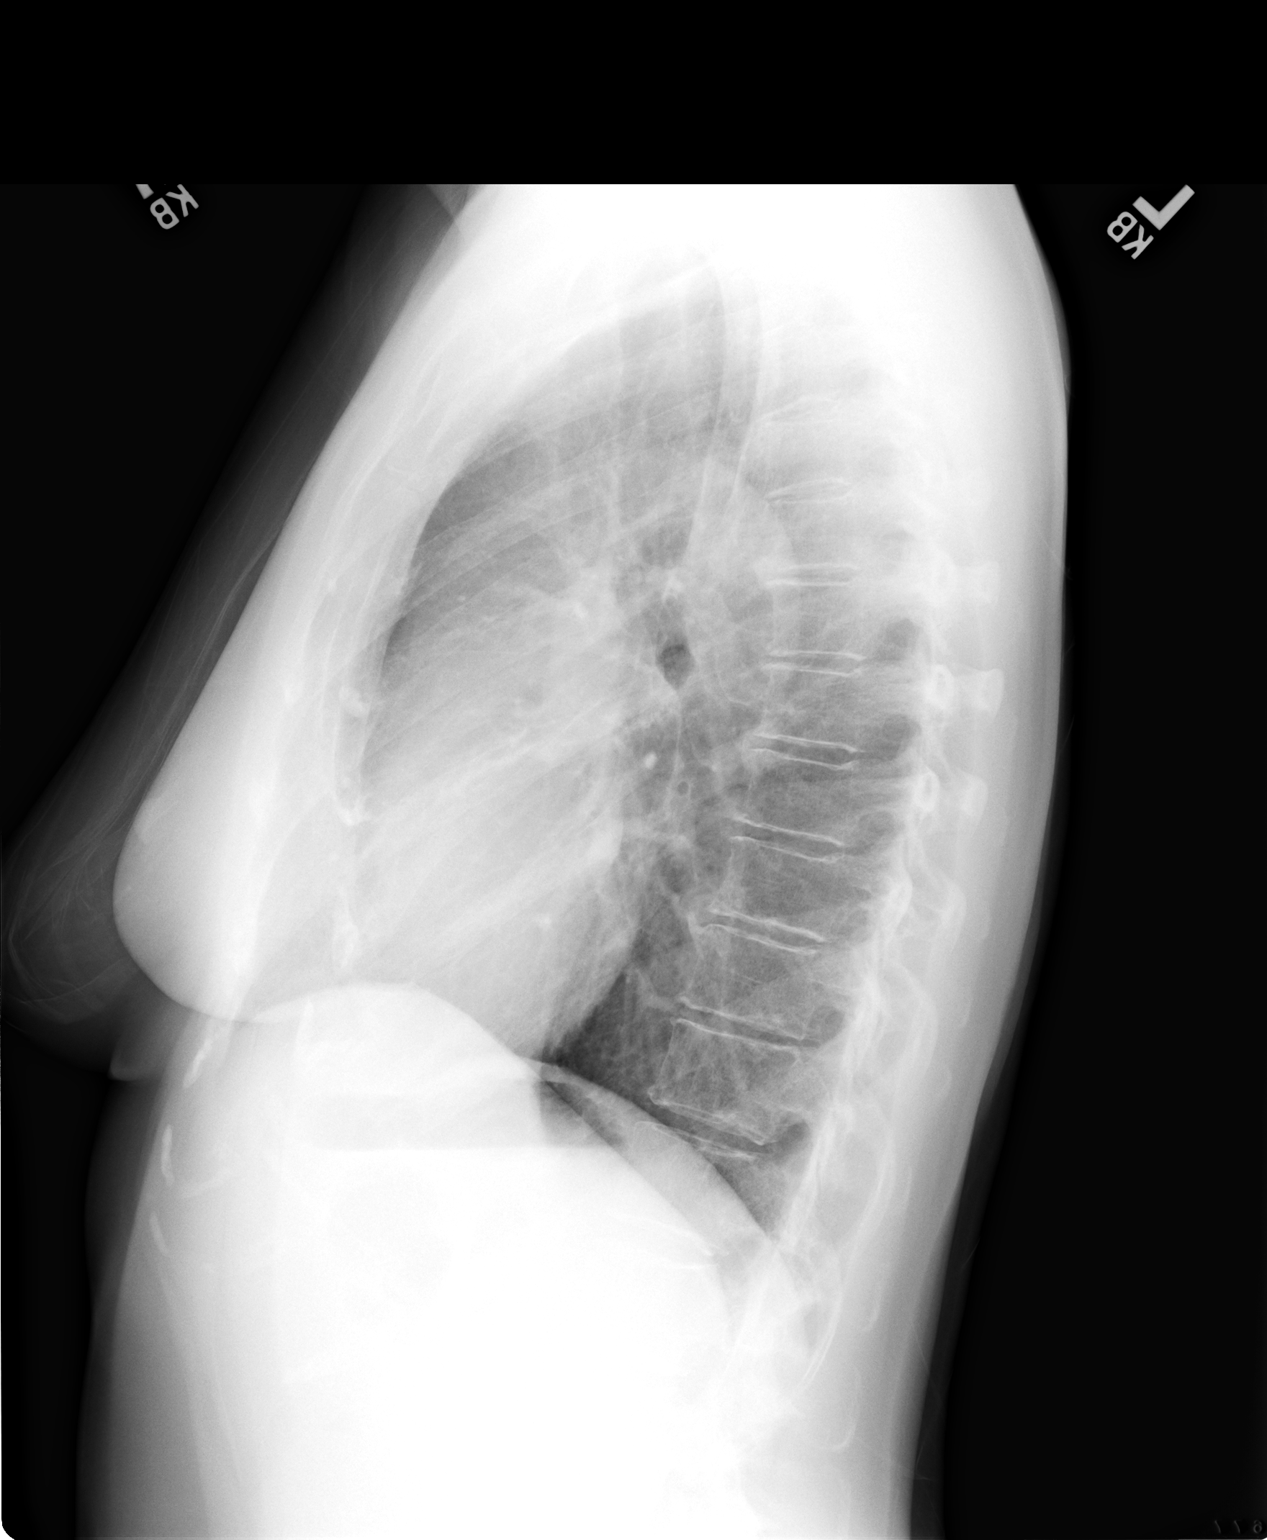

[2 of 2 positions shown; findings below may reference images not displayed]

FINDINGS: Cardiac size is normal.  There is perihilar peribronchial
thickening.  No focal consolidations or pleural effusions are
identified.  Degenerative changes are seen in the spine.
IMPRESSION: Perihilar bronchitic changes.

## 2011-05-09 NOTE — Discharge Summary (Signed)
Colleen Stephens, Colleen Stephens               ACCOUNT NO.:  [id number]   MEDICAL RECORD NO.:  [id number]          PATIENT TYPE:  OIB   LOCATION:  4735                         FACILITY:  [facility]   PHYSICIAN:  Duke Salvia, MD, FACCDATE OF BIRTH:  09/26/1946   DATE OF ADMISSION:  08/11/2009  DATE OF DISCHARGE:  08/12/2009                               DISCHARGE SUMMARY   The patient has allergies to REGLAN and AMOXICILLIN.   TIME OF THIS DICTATION:  Greater than 35 minutes.   FINAL DIAGNOSES:  1. Discharging day 1, status post electrophysiology study.      a.     Dual  atrioventricular nodal physiology (dysrhythmia       difficult to induce).      b.     Radiofrequency catheter ablation with slow pathway       modification.  2. Atrioventricular nodal reentrant tachycardia.      a.     Symptoms are chest tightness, palpitation, dyspnea, and       diaphoresis.      b.     Progressive frequency of supraventricular tachycardia       events.      c.     Episode 3 weeks ago, not self-terminating, the patient       required adenosine challenge in the emergency room.   SECONDARY DIAGNOSES:  1. Echocardiogram on July 15, 2009, ejection fraction 55-60%.      a.     Grade 1 diastolic dysfunction.      b.     Trivial mitral regurgitation - no prolapse this study,       trivial tricuspid regurgitation.  2. Myoview study on July 15, 2009, ejection fraction 56%, no scar and      no ischemia.  3. Orthostatic hypertension.  4. Mitral valve prolapse, but not supported by echocardiogram.  5. Hypertension.  6. Gastroesophageal reflux disease.  7. Allergic rhinitis.  8. Status post hysterectomy, tubal ligation.  9. Depression.  10.Left heart catheterization on February 08, 2009, ejection fraction      preserved.  No evidence of high-grade obstruction.  11.History of migraines.   PROCEDURES:  August 11, 2009, electrophysiology study with successful  slow pathway modification, atrioventricular nodal  reentrant tachycardia,  difficult to induce.  The patient has done well in observation  overnight, maintaining sinus rhythm.  She had mild discomfort at the  right groin where the patient had catheterization insertion.  She had  the beginning incipient migraine, which was halted with Imitrex in the  hours after the procedure.  She had some hypotension overnight and her  medications were held.   Because this dictation is going to an outside Colleen Stephens, Dr. Lovell Stephens  outside the [facility] prominence, I will dictate her discharge  medications.  They are:  1. Enablex 15 mg daily.  2. Imitrex 100 mg as needed for migraine headaches.  3. Ativan 0.5 mg 1 orally up to 4 times daily as needed.  4. Lexapro 10 mg daily.  5. Prevacid 30 mg daily.  6. Xanax 0.5 mg as needed.  7. Cardizem,  which was 30 mg every 6 hours, has been discontinued.  8. Metoprolol 25 mg twice daily, is also discontinued.  9. The patient goes home with a prescription for Darvocet-N 100 one      tablet every 6 hours as needed with no refills.   Laboratory studies pertinent to this admission were drawn on August 06, 2009.  The white cells 7.5, hemoglobin 13, hematocrit 38.4, and  platelets of 199.  Protime is 10 and INR is 1.  Cholesterol is 187,  triglycerides 83, HDL cholesterol 52, and LDL cholesterol 118.  Sodium  is 143, potassium 4.2, chloride 106, carbonate 31, BUN is 17, and  creatinine is 0.9.  Urinalysis was negative.      Colleen Stephens, [place]      Duke Salvia, MD, [facility]  Electronically Signed    GM/MEDQ  D:  08/12/2009  T:  08/12/2009  Job:  [id number]   cc:   Colleen Glaze, MD  Colleen Stephens. Colleen Seltzer, MD

## 2011-05-09 NOTE — Cardiovascular Report (Signed)
NAMEJOWANNA, Colleen Stephens               ACCOUNT NO.:  [id number]   MEDICAL RECORD NO.:  [id number]          PATIENT TYPE:  INP   LOCATION:  2504                         FACILITY:  [facility]   PHYSICIAN:  Arturo Morton. Riley Kill, MD, FACCDATE OF BIRTH:  [birth year]   DATE OF PROCEDURE:  02/08/2009  DATE OF DISCHARGE:                            CARDIAC CATHETERIZATION   PROCEDURES:  1. Left heart catheterization.  2. Selective coronary arteriography.  3. Selective left ventriculography.   DESCRIPTION OF PROCEDURE:  The patient was brought to the  catheterization laboratory and prepped and draped in the usual fashion.  Through an anterior puncture, the right femoral artery was easily  entered.  A 5-French sheath was then placed.  Views of left and right  coronary arteries were obtained in multiple angiographic projections.  We used a Williams right coronary catheter to engage the RCA.  Central  aortic and left ventricular pressures were measured with pigtail.  A  ventriculography was performed in the RAO projection.  Following a  pressure pullback, all catheters were subsequently removed.  The patient  was taken to the holding area for sheath removal.  A 20 mg of  intravenous labetalol was given for elevated blood pressure.  I have  reviewed the pictures of the films with the patient's daughter.   HEMODYNAMIC DATA:  1. Initial central aortic pressure 165/80, mean 114.  2. Left ventricular pressure 170/12.  3. There was no gradient pullback across the aortic valve.   ANGIOGRAPHIC DATA:  1. Ventriculography was done in the RAO projection.  Overall, systolic      function was preserved.  No segmental abnormalities contraction      were identified.  2. The left main is free of critical disease.  3. The left anterior descending artery courses to the apex.  There is      a major diagonal branch, which bifurcates.  The distal LAD      bifurcates also.  No areas of high-grade obstruction are noted.  4.  The circumflex provides a large marginal branch at the very tip of      this, there is evidence of some systolic compression of the very      distal vasculature.  High-grade obstruction leading into this is      not noted, however.  5. The right coronary artery is a fairly large-caliber vessel.  There      is a large posterior descending branch.  There is a small      posterolateral system with mild irregularity in the takeoff of the      AV branch, but no areas of high-grade obstruction.   CONCLUSIONS:  1. Well-preserved left ventricular function without segmental wall      motion abnormality, ejection fraction greater than 55%.  2. No evidence of high-grade obstruction.  3. Minimal systolic compression of the distal vasculature and the      circumflex system.   PLAN:  1. Review the 2-D echo.  2. Adjust medications.  3. Continued medical therapy.      Arturo Morton. Riley Kill, MD, [facility]  Electronically Signed  TDS/MEDQ  D:  02/08/2009  T:  02/09/2009  Job:  [id number]   cc:   [facility]  [name], MD  [name], MD

## 2011-05-09 NOTE — Assessment & Plan Note (Signed)
[name]                            CARDIOLOGY OFFICE NOTE   Colleen Stephens, Colleen Stephens                      MRN:          [id number]  DATE:02/17/2009                            DOB:          March 04, 1946    This is a 65 year old white female patient of Dr. Excell Seltzer who recently  had a cardiac catheterization because of recurrent chest pain.  This  revealed normal LV function and nonobstructive disease, EF 55%, 2-D echo  also confirmed normal ejection fraction.  The patient's troponins were  slightly elevated in the hospital.   Since the patient has been home, she denies any chest pain or  palpitations.  She is doing quite well.  Her main problem is struggling  with depression since her husband's death.   CURRENT MEDICATIONS:  1. Metoprolol 50 mg daily.  2. Prevacid 30 mg daily.  3. Enablex 7.5 mg daily.  4. Imitrex p.r.n.  5. Ativan p.r.n.   PHYSICAL EXAMINATION:  GENERAL:  This is a pleasant 65 year old white  female in no acute distress.  VITAL SIGNS:  Blood pressure 110/72, pulse 51, weight 134.  NECK:  Without JVD, HJR, bruit, or thyroid enlargement.  LUNGS:  Clear anterior, posterior, and lateral.  HEART:  Regular rate and rhythm at 50 beats per minute.  Normal S1 and  S2.  No murmur, rub, bruit, thrill, or heave noted.  ABDOMEN:  Soft  without organomegaly, masses, lesions, or abnormal tenderness.  Right  groin without hematoma or hemorrhage.  She has good distal pulses.   EKG sinus bradycardia 50 beats per minute.  No acute change.   IMPRESSION:  1. Mitral valve prolapse with palpitations, stable.  2. Chest pain with slight increase in troponin, nonobstructive      coronary disease with normal left ventricular function on February 08, 2009.  3. Hypertension.  4. Gastroesophageal reflux disease.  5. Allergic rhinitis.  6. History of hysterectomy.  7. Depression since her husband passed away.   PLAN:  The patient is stable from a  cardiac standpoint.  She will see  Dr. Excell Seltzer back in 6 months or sooner if needed.      Jacolyn Reedy, PA-C  Electronically Signed      Luis Abed, MD, [facility]  Electronically Signed   ML/MedQ  DD: 02/17/2009  DT: 02/18/2009  Job #: [id number]

## 2011-05-09 NOTE — Discharge Summary (Signed)
[name], [name]               ACCOUNT NO.:  [id number]   MEDICAL RECORD NO.:  [id number]          PATIENT TYPE:  INP   LOCATION:  2504                         FACILITY:  [facility]   PHYSICIAN:  [name], MD  DATE OF BIRTH:  1946-08-31   DATE OF ADMISSION:  02/08/2009  DATE OF DISCHARGE:  02/09/2009                               DISCHARGE SUMMARY   PRIMARY CARDIOLOGIST:  [name], MD   PRIMARY CARE PHYSICIAN:  [name], MD   DISCHARGE DIAGNOSIS:  Chest pain/palpitation, status post cardiac  catheterization this admission.  The patient with well-preserved left  ventricular function without segmental wall motion abnormalities.  Ejection fraction greater than 55%.  No evidence of high-grade  obstruction.  Continue beta blocker with additional p.r.n. dose for  palpitation.  A 2-D echocardiogram also showed a normal ejection  fraction of 60%.  The patient was noted to have mild increase in  troponin this admission in the setting of increased stress over the  recent death of her husband.  In the setting of cardiac catheterization,  results are as stated above.   PAST MEDICAL HISTORY:  1. Mitral valve prolapse diagnosed in 1980.  2. Recurrent chest pain.  3. Hypertension.  4. GERD.  5. Allergic rhinitis.  6. History of hysterectomy.  7. Foot surgery.   HOSPITAL COURSE:  Ms. Colleen Stephens is a 65 year old female with a past medical  history as stated above who presented with complaints of chest pain,  palpitations, and tachycardia.  She originally saw Dr. [name] for  tachycardia with a rate in the 130s with no response to carotid massage  and underlying rhythm was noted to be sinus rhythm.  We saw the patient  in consultation.  The patient noted to have minimal elevated troponin.  The patient was started on aspirin and continued on beta blocker.  The  patient was admitted for observation and was evaluated by Dr. Excell Seltzer  with suspicion for acute coronary syndrome.   The patient with decreased  p.o. intake and decreased appetite from several months struggling with  depression since her husband's death.  Initial plan was to proceed with  an echocardiogram in the office, however, the patient with mild bump in  troponin decided to proceed with cardiac catheterization here.  Cath  results as stated above and 2-D echocardiogram results as stated above.  The patient tolerated the procedure without complications.  Dr. Excell Seltzer  in to see the patient on day of discharge.  The patient was stable.  Creatinine 0.84 and hematocrit 34.3.  The patient is being discharged  home.  To continue beta blocker, metoprolol 25 mg b.i.d. and additional  25 mg p.r.n. as needed.  The patient will continue her home meds  including her Enalaplex 50 mg, Imitrex 100 mg, Prevacid 30 mg, Ativan  p.r.n., and Lexapro previous dose.  The patient has been given the post  cardiac catheterization discharge instructions.  She will follow up with  Dr. Earmon Phoenix PA on February 17, 2009 at 12:30 for post cath check and  then follow up with Dr. [name] as needed  for further management.   DURATION OF DISCHARGE ENCOUNTER:  30 minutes.      Dorian Pod, ACNP      [name], MD  Electronically Signed    MB/MEDQ  D:  02/09/2009  T:  02/09/2009  Job:  [id number]

## 2011-05-09 NOTE — Op Note (Signed)
NAMEBERLENE, Colleen Stephens               ACCOUNT NO.:  [id number]   MEDICAL RECORD NO.:  [id number]          PATIENT TYPE:  OIB   LOCATION:  4735                         FACILITY:  [facility]   PHYSICIAN:  Duke Salvia, MD, FACCDATE OF BIRTH:  September 28, 1946   DATE OF PROCEDURE:  08/11/2009  DATE OF DISCHARGE:  08/12/2009                               OPERATIVE REPORT   PREOPERATIVE DIAGNOSIS:  Supraventricular tachycardia.   POSTOPERATIVE DIAGNOSIS:  AV nodal reentry.   PROCEDURE:  Invasive electrophysiological study, isoproterenol infusion,  arrhythmia mapping, and radiofrequency catheter ablation.   Following obtaining informed consent, the patient was brought to the  electrophysiology laboratory and placed on the fluoroscopic table in  supine position.  After routine prep and drape, cardiac catheterization  was performed with local anesthesia and conscious sedation.  Noninvasive  blood pressure monitoring, transcutaneous oxygen saturation monitoring  were performed continuously throughout the procedure.  Following the  procedure, the catheters were removed.  Hemostasis was obtained, and the  patient was transferred to the holding area in a stable condition.   Catheters of 5-French quadripolar catheter was inserted via left femoral  vein to the AV junction to measure the His electrogram.   A 5-French quadripolar catheter was inserted via left femoral vein to  right ventricular apex.   A 6-French octapolar catheter was inserted via right femoral vein to  coronary sinus.   An 8-French 4-mm deflectable tip ablation catheter was inserted via  right femoral vein to mapping sites in the posterior septal space.   Surface leads I, aVF, and V1 were monitored continuously throughout the  procedure.  Following insertion of the catheters, stimulation protocol  included,  1. Incremental atrial pacing.  2. Incremental ventricular pacing.  3. Single atrial extrastimuli at paced cycle length of  600, 500, 400,      and 350 milliseconds.  4. Triple ventricular extrastimuli at paced cycle lengths of 350      milliseconds in the presence of isoproterenol.   END-TIDAL RESULTS:  End-tidal surface electrocardiogram at basic  intervals.   Rhythm is sinus; RR interval is 961 milliseconds; PR interval 152  milliseconds; QRS duration:  101 milliseconds; QT interval 424  milliseconds; P-wave duration 112 milliseconds.   AH interval 82 milliseconds; HV interval 46 milliseconds.   Final:  (On tapering isoproterenol)  Rhythm:  Sinus; RR interval:  748 milliseconds; PR interval 146  milliseconds; QRS duration:  81 milliseconds; QT interval 385  milliseconds; AH interval 66 milliseconds; HV interval:  46  milliseconds.   End-tidal AV nodal function:  AV Wenckebach was 350 milliseconds.  VA Wenckebach was 350 milliseconds.  AV nodal refractoriness was found with no evidence of slow pathway at  350 milliseconds.  Dual antegrade AV nodal physiology was identified and  discontinuity in echo beats were seen pre ablation, but not post  ablation.  Tachycardia was very difficult to induce (see below) and was  not reproducibly so.   Total accessory pathway function:  No evidence of accessory pathway was  identified.   Ventricular response to programmed stimulation was normal for  ventricular stimulation  as described previously.   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  In the setting of isoproterenol at an  induction scheme of 350:270:250: 240 i.e., triple atrial extrastimuli  from the coronary sinus, AV nodal reentry was induced with a cycle  length of 390 milliseconds.  It was terminated with ventricular pacing  at 270 milliseconds.   Characteristics of this tachycardia apart from difficulty in inducing  included an AH interval of 338 milliseconds, HA interval of 45  milliseconds, and VA time of 6 milliseconds.   Fluoroscopy  time, a total of 12 minutes and 59 seconds of fluoroscopy  time was utilized at 7.5 frames per second.   Radiofrequency energy, a total of 6 minutes at 3 seconds of RF was  applied to sites where presumed slow pathway potentials were identified.  It was difficult to get junctional rhythm as evidenced by the prolonged  RF application time, but ultimately antegrade slow pathway function was  eliminated.   IMPRESSION:  1. Normal sinus function.  2. Normal atrial function.  3. Abnormal AV nodal function manifested by inducibility of slow -      fast AV nodal reentry tachycardia.  4. Normal His-Purkinje system function.  5. No accessory pathway.  6. Normal ventricular response to programmed stimulation.   SUMMARY:  In conclusion, the results of electrophysiological testing  identified slow - fast AV nodal reentry tachycardia as the presumed  mechanism of the patient's clinical arrhythmia.  Slow pathway  modification was undertaken with moderate difficulty, but ultimately  successfully.  The patient will be followed up as an outpatient.      Duke Salvia, MD, [facility]  Electronically Signed     SCK/MEDQ  D:  09/16/2009  T:  09/17/2009  Job:  [id number]

## 2011-05-09 NOTE — Consult Note (Signed)
Colleen Stephens, Colleen Stephens               ACCOUNT NO.:  [id number]   MEDICAL RECORD NO.:  [id number]          PATIENT TYPE:  INP   LOCATION:  1417                         FACILITY:  [facility]   PHYSICIAN:  [name], MD  DATE OF BIRTH:  01-01-1946   DATE OF CONSULTATION:  02/05/2009  DATE OF DISCHARGE:                                 CONSULTATION   REASON FOR CONSULTATION:  Chest pain, palpitations, and tachycardia.   HISTORY OF PRESENT ILLNESS:  Colleen Stephens is a 65 year old woman with a  history of mitral valve prolapse who was seen in follow-up today by Dr.  [name].  While she was in the waiting room, she developed palpitations  and sensation that her heart was racing.  An EKG was done and showed  sinus tachycardia.  Orthostatic vital signs were checked and the  patient's systolic blood pressure dropped by 25 mmHg when going from  sitting to standing.  Her heart rate was up in the 130s.  She was  admitted to the hospital for further evaluation.   She has been having a very difficult time since the death of her husband  approximately 6 months ago.  He was diagnosed with pancreatic cancer and  passed away within 1 month.  She notes symptoms of depression, including  poor appetite and continued weight loss over the last several months.  She experiences chest pain in the center of her chest at times of high  stress or when she is feeling extreme grief.  She denies dyspnea, edema,  orthopnea, PND, lightheadedness or syncope.  She has intermittent  palpitations.   At present, she is feeling better after receiving IV fluids.  She has no  other complaints at this time.   PAST MEDICAL HISTORY:  1. Mitral valve prolapse diagnosed in the 1980s.  2. Recurrent chest pain.  3. Hypertension.  4. Gastroesophageal reflux disease.  5. Allergic rhinitis.  6. History of hysterectomy.  7. Foot surgery.   FAMILY HISTORY:  There is no history of premature coronary artery  disease.  The patient's  mother died of a myocardial infarction and  congestive heart failure in her mid 90s.  Father died at age 26 from  cancer.   SOCIAL HISTORY:  The patient is widowed.  She has no history of tobacco  use.  She drinks alcohol only on rare occasion.   HOME MEDICATIONS:  1. Metoprolol succinate 25 mg b.i.d.  2. Enablex 15 mg daily.  3. Imitrex 100 mg as needed.  4. Prevacid 30 mg daily.  5. Ativan 0.5 mg four times daily as needed.  6. Lexapro.   ALLERGIES:  1. REGLAN.  2. AMOXICILLIN   REVIEW OF SYSTEMS:  A complete 12-point review of systems was performed.  Pertinent positives included depression and weight loss.  All other  systems were reviewed and are negative except as outlined in the HPI.   PHYSICAL EXAMINATION:  GENERAL:  The patient is alert and oriented.  She  is in no acute distress.  VITAL SIGNS:  Heart rate is 80, respiratory rate 12, blood pressure is  129/90,  temperature is 98.3.  Oxygen saturation 98% on room air.  HEENT:  Normal.  NECK:  Normal carotid upstrokes.  No bruits.  JVP normal.  No  thyromegaly or thyroid nodules.  LUNGS:  Clear to auscultation  bilaterally.  CHEST:  Equal expansion bilaterally.  HEART:  The apex is discreet and nondisplaced.  HEART:  Regular rate and rhythm with an intermittent midsystolic click.  There are no murmurs or gallops.  ABDOMEN:  Soft, nontender.  No organomegaly, no abdominal bruits.  BACK:  No CVA tenderness.  EXTREMITIES:  No clubbing, cyanosis or edema.  Peripheral pulses 2+ and  equal throughout.  SKIN:  Warm and dry without rash.  NEUROLOGIC:  Cranial nerves II-XII are intact.  Strength is intact and  equal.   LABORATORY DATA:  White blood cell count 7.7, hemoglobin 13.7,  hematocrit 40.0, platelet count is 229,000.  INR is 1.0.  Serum  chemistries are normal with potassium 4.1, creatinine 0.8.  CK MB is  2.3, total CK is 57.  Troponin is 0.14.   EKG is currently pending.  It was interpreted as sinus tachycardia  at  Dr. [name]' office.   Telemetry reviewed and the patient is in sinus rhythm, currently with a  heart rate of 80.   ASSESSMENT:  1. Chest pain.  With a history of mitral valve prolapse and chest pain      intermittently for many years, I suspect that this is not      representative of an acute coronary syndrome.  However, she does      have a minimally elevated troponin.  We will cycle cardiac enzymes      for further evaluation.  We will start daily aspirin.  We will not      start on fractionated heparin unless she develops recurrent chest      pain or an upward trend in her cardiac biomarkers.  Will Toprol XL      to 37.5 mg twice daily.  2. Palpitations.  Rhythm is currently sinus.  Will monitor on      telemetry.  Increase beta-blocker as outlined.  I suspect that her      tachycardia was due to a stress reaction as going to Dr. [name]'      office reminded her of her husband and she became upset at this.  3. Will await further results of serial enzymes and EKGs prior to      deciding on further evaluation.  If she has no      further problems would plan on a stress echocardiogram after her      discharge home.  If her enzymes trend upward or she has recurrent      problems with chest pain or pressure, she may require a cardiac      catheterization.   Thanks for the opportunity to see this very nice patient.      [name], MD  Electronically Signed     MDC/MEDQ  D:  02/05/2009  T:  02/05/2009  Job:  [id number]   cc:   [name], MD  [address] [place]  [place] [address]

## 2011-05-11 ENCOUNTER — Telehealth: Payer: Self-pay | Admitting: Internal Medicine

## 2011-05-11 NOTE — Telephone Encounter (Signed)
lmovm Debbie Atwell Mcdanel RN  

## 2011-05-11 NOTE — Telephone Encounter (Signed)
They need to know the last LDL from 2011

## 2011-05-12 NOTE — Op Note (Signed)
[place]  Patient:    Colleen Stephens, Colleen Stephens Visit Number: [id number] MRN: [id number]          Service Type: DSU Location: [facility] Attending Physician:  Eloise Levels Dictated by:   Mary A. Contogiannis, M.D. Proc. 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  PROCEDURES: 1. Excision of 0.6 cm atypical solar lentigo/possible early lentigo maligna,    left cheek. 2. Intermediate closure of 1.2 cm left cheek incision.  ATTENDING SURGEON:  Mary A. Contogiannis, M.D.  ANESTHESIA:  1% lidocaine with epinephrine.  COMPLICATIONS: None.  INDICATIONS FOR THE PROCEDURE:  The patient is a 65 year old Caucasian female, who was seen by Dr. Campbell Stall for evaluation of this skin lesion on her left cheek.  Apparently, the skin lesion had an irregular shape and had a black spot in the middle of it.  It was slightly raised and an irregular round shape.  Dr. Danella Deis biopsied the skin lesion and the pathology report indicated that it was an atypical solar lentigo that was difficult to exclude from an early evolving stage of lentigo maligna.  Dr. Danella Deis therefore referred the patient to me for re-excision of the lesion.  I discussed the pathology with Dr. Lorelle Formosa, the dermato pathologist, who examined this specimen.  Dr. Cliffton Asters said, that indeed it was difficult to fully evaluate the specimen because it was not a full-thickness skin specimen.  After discussing this with him, we felt that a 2 mm margin would be sufficient for excision around this skin lesion if indeed it was an early lentigo maligna.  Since it may possibly be a melanoma, an intraoperative frozen section will not be appropriate.  The patient therefore requested that  we proceed with the surgery at this time.  DESCRIPTION OF PROCEDURE:  The patient was brought into the minor room and placed on the table in a supine position.  The left cheek was prepped with Betadine and draped in a sterile fashion.  Using loupe magnification, the skin lesion was examined.  It was a healing biopsy site.  I outlined 2 mm margins circumferentially around the skin lesion.  The skin lesion area was then injected with 1% lidocaine with epinephrine.  After adequate anesthesia and hemostasis had taken effect, the procedure was begun.  The skin lesion was sharply excised with 2 mm margins in a circumferential fashion.  This was done full-thickness through the skin into the subcutaneous tissues.  The specimen was marked at the 12 oclock position and then passed off the table to undergo permanent pathologic section evaluation.  The skin edges were then undermined for easy closure.  The deeper subcutaneous tissues were closed with a 5-0 Monocryl suture.  The dermal layer was then closed with 5-0 Monocryl suture as well.  The skin edges were then closed with a 6-0 Prolene in a running baseball-type stitch.  The incision was dressed with bacitracin ointment and a Band-Aid.  There were no complications.  The patient tolerated the procedure well.  The patient was then taught proper wound care, discharged home in stable condition.  She will return to the office tomorrow for a follow-up appointment. Dictated by:   Mary A.  Contogiannis, M.D. Attending Physician:  Eloise Levels DD:  11/12/01 TD:  11/13/01 Job: [id number]

## 2011-05-12 NOTE — Assessment & Plan Note (Signed)
[facility]                         GASTROENTEROLOGY OFFICE NOTE   TORIANNA, JUNIO                      MRN:          [id number]  DATE:12/28/2005                            DOB:          07-27-1946    REASON FOR REFERRAL:  Dr. [name] asked me to evaluate to Ms. Ozanich in  consultation regarding colorectal cancer screening.   HISTORY OF PRESENT ILLNESS:  Ms. Bierly is a very pleasant 65 year old  woman at routine risk for colorectal cancer.  Specifically, she has no  family history for colon cancer.  She, herself, underwent a colonoscopy  by Dr. Ritta Slot in September of 1999, and was found to have a normal  colon.  She has had fecal occult blood testing since then, but she has  never been told that these are positive.  She had recent lab testing  done in October of 2007 showing a normal CBC and essentially a normal  complete metabolic profile.  She has had no signs of overt GI bleeding.  She is bothered by constipation; it has been a chronic problem for her.  She has tried fiber supplements, but cannot stand the taste of the fiber  supplements.  She has never tried MiraLax.   REVIEW OF SYSTEMS:  Notable for a 15-pound weight gain in the past year.  The rest of her review of systems is essentially normal, and is  available on the nursing intake sheet.   PAST MEDICAL HISTORY:  1. Hysterectomy.  2. Tubal ligation.  3. Mitral valve prolapse.  4. Status post colonoscopy in 1999 with Dr. Ritta Slot that was      normal.   CURRENT MEDICATIONS:  Metoprolol, Prevacid, Enablex, Vivelle, Imitrex.   ALLERGIES:  1. REGLAN.  2. TRIMOX.   SOCIAL HISTORY:  Married with 2 children.  Works in Programme researcher, broadcasting/film/video.  Nonsmoker.  Drinks alcohol infrequently.   FAMILY HISTORY:  No colon cancer or colon polyps in family.   PHYSICAL EXAMINATION:  Height 5 feet 5 inches, 168 pounds.  Blood  pressure 120/84.  Pulse 64.  CONSTITUTIONAL:  Generally well appearing.  NEUROLOGIC:  Alert and oriented x3.  EYES:  Extraocular movements intact.  MOUTH:  Oropharynx moist.  No lesions.  NECK:  No lymphadenopathy.  CARDIOVASCULAR:  Heart regular rate and rhythm.  LUNGS:  Clear to auscultation bilaterally.  ABDOMEN:  Soft, non-tender and non-distended.  Normal bowel sounds.  EXTREMITIES:  No lower extremity edema.  SKIN:  No rashes or lesions on visible extremities.   ASSESSMENT AND PLAN:  A 65 year old woman at routine risk for colorectal  cancer.   Ms. Horkey underwent a full colonoscopy with Dr. Ritta Slot in 1999, and  with current colorectal cancer screening guidelines, does not need  colonoscopy for 10 years from then.  That would put her at September of  2009.  She, importantly, has no symptoms of colon cancer.  She is not  anemic.  She has no overt bleeding.  She has constipation, but this has  been a chronic problem of hers for years.  I recommended that she try  MiraLax for  that.  I will get in touch with Dr. [name]' office to see  if any of her recent fecal occult blood testing have been positive.  If  that is the case, probably we should proceed with colonoscopy sooner  than 2009.     Rachael Fee, MD  Electronically Signed    DPJ/MedQ  DD: 12/28/2006  DT: 12/28/2006  Job #: [id number]   cc:   [name], MD

## 2011-05-16 ENCOUNTER — Encounter: Payer: Self-pay | Admitting: Internal Medicine

## 2011-05-26 ENCOUNTER — Ambulatory Visit (INDEPENDENT_AMBULATORY_CARE_PROVIDER_SITE_OTHER): Payer: 59 | Admitting: Internal Medicine

## 2011-05-26 ENCOUNTER — Encounter: Payer: Self-pay | Admitting: Internal Medicine

## 2011-05-26 VITALS — BP 146/84 | HR 72 | Temp 98.2°F | Resp 16 | Ht 64.0 in | Wt 166.0 lb

## 2011-05-26 DIAGNOSIS — Z0000 Encounter for general adult medical examination without abnormal findings: Secondary | ICD-10-CM

## 2011-05-26 LAB — BASIC METABOLIC PANEL
CO2: 29 mEq/L (ref 19–32)
Chloride: 108 mEq/L (ref 96–112)
Sodium: 144 mEq/L (ref 135–145)

## 2011-05-26 LAB — POCT URINALYSIS DIPSTICK
Bilirubin, UA: NEGATIVE
Glucose, UA: NEGATIVE
Ketones, UA: NEGATIVE
pH, UA: 5

## 2011-05-26 LAB — CBC WITH DIFFERENTIAL/PLATELET
Basophils Relative: 0.6 % (ref 0.0–3.0)
Eosinophils Absolute: 0.1 10*3/uL (ref 0.0–0.7)
HCT: 40.8 % (ref 36.0–46.0)
Hemoglobin: 13.8 g/dL (ref 12.0–15.0)
MCHC: 33.8 g/dL (ref 30.0–36.0)
MCV: 90.2 fl (ref 78.0–100.0)
Monocytes Absolute: 0.7 10*3/uL (ref 0.1–1.0)
Neutro Abs: 3.2 10*3/uL (ref 1.4–7.7)
RBC: 4.53 Mil/uL (ref 3.87–5.11)

## 2011-05-26 LAB — LIPID PANEL
HDL: 64.1 mg/dL (ref >39.00)
Triglycerides: 102 mg/dL (ref 0.0–149.0)

## 2011-05-26 LAB — HEPATIC FUNCTION PANEL
Albumin: 4.1 g/dL (ref 3.5–5.2)
Alkaline Phosphatase: 64 U/L (ref 39–117)
Total Protein: 6.9 g/dL (ref 6.0–8.3)

## 2011-05-26 NOTE — Progress Notes (Signed)
Subjective:    Patient ID: Colleen Stephens, female    DOB: [birth year], 65 y.o.   MRN: [id number]  HPI  Patient presents for complete physical examination as noted her vitals her blood pressure dropping her weight is up 20 pounds from ideal body mass.  As noted on review of her vital signs her weight was down her blood pressure was normal.  Rather than put her on blood pressure medicines we discussed weight loss and exercise she did have orthoscopic surgery underneath which put a lot of exercise for quite some time she is recovering now we talked about water aerobics and bike riding.    Review of Systems  Constitutional: Negative for activity change, appetite change and fatigue.  HENT: Negative for ear pain, congestion, neck pain, postnasal drip and sinus pressure.   Eyes: Negative for redness and visual disturbance.  Respiratory: Negative for cough, shortness of breath and wheezing.   Gastrointestinal: Negative for abdominal pain and abdominal distention.  Genitourinary: Negative for dysuria, frequency and menstrual problem.  Musculoskeletal: Negative for myalgias, joint swelling and arthralgias.  Skin: Negative for rash and wound.  Neurological: Negative for dizziness, weakness and headaches.  Hematological: Negative for adenopathy. Does not bruise/bleed easily.  Psychiatric/Behavioral: Negative for sleep disturbance and decreased concentration.   Past Medical History  Diagnosis Date  . Painful respiration   . Acute bronchitis   . Adjustment disorder with depressed mood   . Bladder spasm   . GERD (gastroesophageal reflux disease)   . Headache   . Rosacea   . Allergy   . History of mitral valve prolapse    Past Surgical History  Procedure Date  . Abdominal hysterectomy   . Ahand tendon repair on left hand   . Foot surgery twice   . Tubal ligation     reports that she has never smoked. She does not have any smokeless tobacco history on file. She reports that she does not  drink alcohol or use illicit drugs. family history includes Cancer in her father and Heart disease in her mother. Allergies  Allergen Reactions  . Amoxicillin     REACTION: hives  . Metoclopramide Hcl     REACTION: tongue swelling        Objective:   Physical Exam  [nursing notereviewed. Constitutional: She is oriented to person, place, and time. She appears well-developed and well-nourished. No distress.  HENT:  Head: Normocephalic and atraumatic.  Right Ear: External ear normal.  Left Ear: External ear normal.  Nose: Nose normal.  Mouth/Throat: Oropharynx is clear and moist.  Eyes: Conjunctivae and EOM are normal. Pupils are equal, round, and reactive to light.  Neck: Normal range of motion. Neck supple. No JVD present. No tracheal deviation present. No thyromegaly present.  Cardiovascular: Normal rate, regular rhythm, normal heart sounds and intact distal pulses.   No murmur heard. Pulmonary/Chest: Effort normal and breath sounds normal. She has no wheezes. She exhibits no tenderness.  Abdominal: Soft. Bowel sounds are normal.  Musculoskeletal: Normal range of motion. She exhibits no edema and no tenderness.  Lymphadenopathy:    She has no cervical adenopathy.  Neurological: She is alert and oriented to person, place, and time. She has normal reflexes. No cranial nerve deficit.  Skin: Skin is warm and dry. She is not diaphoretic.  Psychiatric: She has a normal mood and affect. Her behavior is normal.          Assessment & Plan:   This is a routine physical examination  for this healthy  Female. Reviewed all health maintenance protocols including mammography colonoscopy bone density and reviewed appropriate screening labs. Her immunization history was reviewed as well as her current medications and allergies refills of her chronic medications were given and the plan for yearly health maintenance was discussed all orders and referrals were made as appropriate.

## 2011-05-26 NOTE — Patient Instructions (Signed)
1. Vary diet 2. consider bike riding or swimming or just aerobics and the pool 3. ice the knee at the end of the day as long as it continues to swell slightly 4. take double strength glucosamine chondroitin (Osteo Bi-Flex)

## 2011-09-29 ENCOUNTER — Ambulatory Visit (INDEPENDENT_AMBULATORY_CARE_PROVIDER_SITE_OTHER): Payer: 59 | Admitting: Internal Medicine

## 2011-09-29 ENCOUNTER — Encounter: Payer: Self-pay | Admitting: Internal Medicine

## 2011-09-29 VITALS — BP 138/88 | HR 80 | Temp 98.2°F | Resp 16 | Ht 65.0 in | Wt 168.0 lb

## 2011-09-29 DIAGNOSIS — K219 Gastro-esophageal reflux disease without esophagitis: Secondary | ICD-10-CM

## 2011-09-29 DIAGNOSIS — Z23 Encounter for immunization: Secondary | ICD-10-CM

## 2011-09-29 DIAGNOSIS — I10 Essential (primary) hypertension: Secondary | ICD-10-CM

## 2011-09-29 MED ORDER — SOLIFENACIN SUCCINATE 10 MG PO TABS: 5.0000 mg | ORAL_TABLET | Freq: Every day | ORAL | Status: DC

## 2011-09-29 MED ORDER — CELECOXIB 200 MG PO CAPS: 200.0000 mg | ORAL_CAPSULE | Freq: Two times a day (BID) | ORAL | Status: DC

## 2011-09-29 NOTE — Patient Instructions (Signed)
Patient was instructed to continue all medications as prescribed. To stop at the checkout desk and schedule a followup appointment  

## 2011-12-25 NOTE — Progress Notes (Signed)
  Subjective:    Patient ID: Colleen Stephens, female    DOB: 1946-10-02, 65 y.o.   MRN: [id number]  HPI  Patient is a 65 year old white female who is followed for gastroesophageal reflux and allergic rhinitis she has a history of mitral valve prolapse but she states that she has not had any palpitations shortness of breath or any symptoms of worsening valvular disease.  She does have a history of GERD for which he takes over-the-counter PPI or H2 blockers on a when necessary basis   she has been doing well exercising regularly and has stable vitals  Review of Systems  Constitutional: Negative for activity change, appetite change and fatigue.  HENT: Negative for ear pain, congestion, neck pain, postnasal drip and sinus pressure.   Eyes: Negative for redness and visual disturbance.  Respiratory: Negative for cough, shortness of breath and wheezing.   Gastrointestinal: Negative for abdominal pain and abdominal distention.  Genitourinary: Negative for dysuria, frequency and menstrual problem.  Musculoskeletal: Negative for myalgias, joint swelling and arthralgias.  Skin: Negative for rash and wound.  Neurological: Negative for dizziness, weakness and headaches.  Hematological: Negative for adenopathy. Does not bruise/bleed easily.  Psychiatric/Behavioral: Negative for sleep disturbance and decreased concentration.       Objective:   Physical Exam  Vitals reviewed. Constitutional: She is oriented to person, place, and time. She appears well-developed and well-nourished. No distress.  HENT:  Head: Normocephalic and atraumatic.  Right Ear: External ear normal.  Left Ear: External ear normal.  Nose: Nose normal.  Mouth/Throat: Oropharynx is clear and moist.  Eyes: Conjunctivae and EOM are normal. Pupils are equal, round, and reactive to light.  Neck: Normal range of motion. Neck supple. No JVD present. No tracheal deviation present. No thyromegaly present.  Cardiovascular: Normal rate,  regular rhythm, normal heart sounds and intact distal pulses.   No murmur heard. Pulmonary/Chest: Effort normal and breath sounds normal. She has no wheezes. She exhibits no tenderness.  Abdominal: Soft. Bowel sounds are normal.  Musculoskeletal: Normal range of motion. She exhibits no edema and no tenderness.  Lymphadenopathy:    She has no cervical adenopathy.  Neurological: She is alert and oriented to person, place, and time. She has normal reflexes. No cranial nerve deficit.  Skin: Skin is warm and dry. She is not diaphoretic.  Psychiatric: She has a normal mood and affect. Her behavior is normal.          Assessment & Plan:  She is stable on her current medications A. flu shot was given today She'll return for complete physical in 4 months Her current medications are VESIcare for her bladder for which samples were given and Celebrex for arthritis blood pressures been well-controlled with her weight loss

## 2012-04-19 ENCOUNTER — Encounter: Payer: 59 | Admitting: Internal Medicine

## 2012-04-22 ENCOUNTER — Encounter: Payer: Self-pay | Admitting: Internal Medicine

## 2012-04-22 ENCOUNTER — Ambulatory Visit (INDEPENDENT_AMBULATORY_CARE_PROVIDER_SITE_OTHER): Payer: Commercial Indemnity | Admitting: Internal Medicine

## 2012-04-22 VITALS — BP 130/80 | HR 72 | Temp 98.3°F | Resp 16 | Ht 64.0 in | Wt 160.0 lb

## 2012-04-22 DIAGNOSIS — K219 Gastro-esophageal reflux disease without esophagitis: Secondary | ICD-10-CM

## 2012-04-22 DIAGNOSIS — Z0000 Encounter for general adult medical examination without abnormal findings: Secondary | ICD-10-CM

## 2012-04-22 DIAGNOSIS — N318 Other neuromuscular dysfunction of bladder: Secondary | ICD-10-CM

## 2012-04-22 DIAGNOSIS — M1990 Unspecified osteoarthritis, unspecified site: Secondary | ICD-10-CM

## 2012-04-22 DIAGNOSIS — N3281 Overactive bladder: Secondary | ICD-10-CM

## 2012-04-22 LAB — CBC WITH DIFFERENTIAL/PLATELET
Basophils Relative: 0.3 % (ref 0.0–3.0)
Eosinophils Relative: 1.9 % (ref 0.0–5.0)
HCT: 39.5 % (ref 36.0–46.0)
Hemoglobin: 13.1 g/dL (ref 12.0–15.0)
Lymphs Abs: 2.2 10*3/uL (ref 0.7–4.0)
MCV: 90 fl (ref 78.0–100.0)
Monocytes Absolute: 0.5 10*3/uL (ref 0.1–1.0)
Neutro Abs: 2.6 10*3/uL (ref 1.4–7.7)
Platelets: 189 10*3/uL (ref 150.0–400.0)
RBC: 4.39 Mil/uL (ref 3.87–5.11)
WBC: 5.4 10*3/uL (ref 4.5–10.5)

## 2012-04-22 LAB — POCT URINALYSIS DIPSTICK
Blood, UA: NEGATIVE
Nitrite, UA: NEGATIVE
Protein, UA: NEGATIVE
pH, UA: 5.5

## 2012-04-22 LAB — BASIC METABOLIC PANEL
BUN: 17 mg/dL (ref 6–23)
Chloride: 105 mEq/L (ref 96–112)
Potassium: 4.2 mEq/L (ref 3.5–5.1)
Sodium: 142 mEq/L (ref 135–145)

## 2012-04-22 LAB — LIPID PANEL
Total CHOL/HDL Ratio: 2
VLDL: 13.2 mg/dL (ref 0.0–40.0)

## 2012-04-22 LAB — HEPATIC FUNCTION PANEL
ALT: 25 U/L (ref 0–35)
AST: 29 U/L (ref 0–37)
Albumin: 4.3 g/dL (ref 3.5–5.2)
Total Protein: 7.5 g/dL (ref 6.0–8.3)

## 2012-04-22 LAB — TSH: TSH: 1.82 u[IU]/mL (ref 0.35–5.50)

## 2012-04-22 MED ORDER — CELECOXIB 200 MG PO CAPS: 200.0000 mg | ORAL_CAPSULE | Freq: Two times a day (BID) | ORAL | Status: DC

## 2012-04-22 MED ORDER — FESOTERODINE FUMARATE ER 4 MG PO TB24: 4.0000 mg | ORAL_TABLET | Freq: Every day | ORAL | Status: DC

## 2012-04-22 NOTE — Progress Notes (Signed)
Subjective:    Patient ID: Colleen Stephens, female    DOB: 03/07/1946, 66 y.o.   MRN: [id number]  HPI  Taking a nutrition course of exercise course.  She has a history of overactive bladder and has been taking a medication that is no longer formulary, VESIcare,. Gen. she is been doing well although the medications but she requires formulary change of this medication   Review of Systems  Constitutional: Negative for activity change, appetite change and fatigue.  HENT: Negative for ear pain, congestion, neck pain, postnasal drip and sinus pressure.   Eyes: Negative for redness and visual disturbance.  Respiratory: Negative for cough, shortness of breath and wheezing.   Gastrointestinal: Negative for abdominal pain and abdominal distention.  Genitourinary: Negative for dysuria, frequency and menstrual problem.  Musculoskeletal: Negative for myalgias, joint swelling and arthralgias.  Skin: Negative for rash and wound.  Neurological: Negative for dizziness, weakness and headaches.  Hematological: Negative for adenopathy. Does not bruise/bleed easily.  Psychiatric/Behavioral: Negative for sleep disturbance and decreased concentration.   Past Medical History  Diagnosis Date  . Painful respiration   . Acute bronchitis   . Adjustment disorder with depressed mood   . Bladder spasm   . GERD (gastroesophageal reflux disease)   . Headache   . Rosacea   . Allergy   . History of mitral valve prolapse     History   Social History  . Marital Status: Widowed    Spouse Name: N/A    Number of Children: N/A  . Years of Education: N/A   Occupational History  . retired    Social History Main Topics  . Smoking status: Never Smoker   . Smokeless tobacco: Not on file  . Alcohol Use: No  . Drug Use: No  . Sexually Active: Not on file   Other Topics Concern  . Not on file   Social History Narrative  . No narrative on file    Past Surgical History  Procedure Date  . Abdominal  hysterectomy   . Ahand tendon repair on left hand   . Foot surgery twice   . Tubal ligation     Family History  Problem Relation Age of Onset  . Heart disease Mother   . Cancer Father     Allergies  Allergen Reactions  . Amoxicillin     REACTION: hives  . Metoclopramide Hcl     REACTION: tongue swelling    Current Outpatient Prescriptions on File Prior to Visit  Medication Sig Dispense Refill  . celecoxib (CELEBREX) 200 MG capsule Take 1 capsule (200 mg total) by mouth 2 (two) times daily.  180 capsule  3  . solifenacin (VESICARE) 10 MG tablet Take 0.5 tablets (5 mg total) by mouth daily.  90 tablet  3    BP 130/80  Pulse 72  Temp 98.3 F (36.8 C)  Resp 16  Ht 5\' 4"  (1.626 m)  Wt 160 lb (72.576 kg)  BMI 27.46 kg/m2        Objective:   Physical Exam  Nursing note and vitals reviewed. Constitutional: She is oriented to person, place, and time. She appears well-developed and well-nourished. No distress.  HENT:  Head: Normocephalic and atraumatic.  Right Ear: External ear normal.  Left Ear: External ear normal.  Nose: Nose normal.  Mouth/Throat: Oropharynx is clear and moist.  Eyes: Conjunctivae and EOM are normal. Pupils are equal, round, and reactive to light.  Neck: Normal range of motion. Neck supple. No JVD  present. No tracheal deviation present. No thyromegaly present.  Cardiovascular: Normal rate, regular rhythm, normal heart sounds and intact distal pulses.   No murmur heard. Pulmonary/Chest: Effort normal and breath sounds normal. She has no wheezes. She exhibits no tenderness.  Abdominal: Soft. Bowel sounds are normal.  Musculoskeletal: Normal range of motion. She exhibits no edema and no tenderness.  Lymphadenopathy:    She has no cervical adenopathy.  Neurological: She is alert and oriented to person, place, and time. She has normal reflexes. No cranial nerve deficit.  Skin: Skin is warm and dry. She is not diaphoretic.  Psychiatric: She has a  normal mood and affect. Her behavior is normal.          Assessment & Plan:   This is a routine physical examination for this healthy  Female. Reviewed all health maintenance protocols including mammography colonoscopy bone density and reviewed appropriate screening labs. Her immunization history was reviewed as well as her current medications and allergies refills of her chronic medications were given and the plan for yearly health maintenance was discussed all orders and referrals were made as appropriate.   Will give her samples of a new formulary  medication as we discontinue the VESIcare we will titrate this medication from 4 mg to 8 mg as needed for symptoms

## 2012-04-23 LAB — VITAMIN D 25 HYDROXY (VIT D DEFICIENCY, FRACTURES): Vit D, 25-Hydroxy: 35 ng/mL (ref 30–89)

## 2012-04-24 ENCOUNTER — Telehealth: Payer: Self-pay | Admitting: Internal Medicine

## 2012-04-24 NOTE — Telephone Encounter (Signed)
Pt would like blood work result  °

## 2012-04-24 NOTE — Telephone Encounter (Signed)
Pt informed wnl--mailed copy o flabs

## 2012-10-21 ENCOUNTER — Ambulatory Visit: Payer: Commercial Indemnity | Admitting: Internal Medicine

## 2012-11-12 ENCOUNTER — Ambulatory Visit (INDEPENDENT_AMBULATORY_CARE_PROVIDER_SITE_OTHER): Payer: Commercial Indemnity | Admitting: Internal Medicine

## 2012-11-12 ENCOUNTER — Encounter: Payer: Self-pay | Admitting: Internal Medicine

## 2012-11-12 VITALS — BP 130/80 | HR 88 | Temp 98.1°F | Resp 16 | Ht 64.0 in | Wt 160.0 lb

## 2012-11-12 DIAGNOSIS — R030 Elevated blood-pressure reading, without diagnosis of hypertension: Secondary | ICD-10-CM

## 2012-11-12 DIAGNOSIS — E785 Hyperlipidemia, unspecified: Secondary | ICD-10-CM

## 2012-11-12 DIAGNOSIS — T887XXA Unspecified adverse effect of drug or medicament, initial encounter: Secondary | ICD-10-CM

## 2012-11-12 NOTE — Progress Notes (Signed)
Subjective:    Patient ID: Colleen Stephens, female    DOB: 20-May-1946, 66 y.o.   MRN: [id number]  HPI  Stable blood pressure off medications currently on Celebrex for osteoarthritis and toviaz for overactive bladder. On these medications. Lipid have been in control but diet has not been as good as prior  Weight is stable generally doing well has physical scheduled for 6 months    Review of Systems  Constitutional: Negative for activity change, appetite change and fatigue.  HENT: Negative for ear pain, congestion, neck pain, postnasal drip and sinus pressure.   Eyes: Negative for redness and visual disturbance.  Respiratory: Negative for cough, shortness of breath and wheezing.   Gastrointestinal: Negative for abdominal pain and abdominal distention.  Genitourinary: Negative for dysuria, frequency and menstrual problem.  Musculoskeletal: Negative for myalgias, joint swelling and arthralgias.  Skin: Negative for rash and wound.  Neurological: Negative for dizziness, weakness and headaches.  Hematological: Negative for adenopathy. Does not bruise/bleed easily.  Psychiatric/Behavioral: Negative for sleep disturbance and decreased concentration.   Past Medical History  Diagnosis Date  . Painful respiration   . Acute bronchitis   . Adjustment disorder with depressed mood   . Bladder spasm   . GERD (gastroesophageal reflux disease)   . Headache   . Rosacea   . Allergy   . History of mitral valve prolapse     History   Social History  . Marital Status: Widowed    Spouse Name: N/A    Number of Children: N/A  . Years of Education: N/A   Occupational History  . retired    Social History Main Topics  . Smoking status: Never Smoker   . Smokeless tobacco: Not on file  . Alcohol Use: No  . Drug Use: No  . Sexually Active: Not on file   Other Topics Concern  . Not on file   Social History Narrative  . No narrative on file    Past Surgical History  Procedure Date  .  Abdominal hysterectomy   . Ahand tendon repair on left hand   . Foot surgery twice   . Tubal ligation     Family History  Problem Relation Age of Onset  . Heart disease Mother   . Cancer Father     Allergies  Allergen Reactions  . Amoxicillin     REACTION: hives  . Metoclopramide Hcl     REACTION: tongue swelling    Current Outpatient Prescriptions on File Prior to Visit  Medication Sig Dispense Refill  . celecoxib (CELEBREX) 200 MG capsule Take 1 capsule (200 mg total) by mouth 2 (two) times daily.  180 capsule  3  . fesoterodine (TOVIAZ) 4 MG TB24 Take 1 tablet (4 mg total) by mouth daily.  30 tablet  6  . [DISCONTINUED] solifenacin (VESICARE) 10 MG tablet Take 0.5 tablets (5 mg total) by mouth daily.  90 tablet  3    BP 130/80  Pulse 88  Temp 98.1 F (36.7 C)  Resp 16  Ht 5\' 4"  (1.626 m)  Wt 160 lb (72.576 kg)  BMI 27.46 kg/m2        Objective:   Physical Exam  Nursing note and vitals reviewed. Constitutional: She is oriented to person, place, and time. She appears well-developed and well-nourished.  HENT:  Head: Normocephalic and atraumatic.  Eyes: Conjunctivae normal are normal. Pupils are equal, round, and reactive to light.  Cardiovascular: Normal rate and regular rhythm.   Pulmonary/Chest: Effort normal  and breath sounds normal.  Abdominal: Soft. Bowel sounds are normal.  Musculoskeletal: Normal range of motion.  Neurological: She is alert and oriented to person, place, and time.          Assessment & Plan:  Next visit CPX Stable blood pressure with weight loss and diet off of all medications' continue to monitor and discussed if weight gain occurs then at risk for elevation of blood pressure reviewed lipid and lipid management

## 2013-01-05 NOTE — Patient Instructions (Signed)
The patient is instructed to continue all medications as prescribed. Schedule followup with check out clerk upon leaving the clinic  

## 2013-02-08 ENCOUNTER — Other Ambulatory Visit: Payer: Self-pay

## 2013-02-10 ENCOUNTER — Encounter: Payer: Self-pay | Admitting: Internal Medicine

## 2013-04-24 ENCOUNTER — Other Ambulatory Visit: Payer: Self-pay | Admitting: Internal Medicine

## 2013-05-12 ENCOUNTER — Other Ambulatory Visit (INDEPENDENT_AMBULATORY_CARE_PROVIDER_SITE_OTHER): Payer: Medicare Other

## 2013-05-12 ENCOUNTER — Encounter: Payer: Self-pay | Admitting: Internal Medicine

## 2013-05-12 ENCOUNTER — Ambulatory Visit (INDEPENDENT_AMBULATORY_CARE_PROVIDER_SITE_OTHER): Payer: Medicare Other | Admitting: Internal Medicine

## 2013-05-12 VITALS — BP 140/80 | HR 76 | Temp 98.0°F | Resp 16 | Ht 64.0 in | Wt 164.0 lb

## 2013-05-12 DIAGNOSIS — N3281 Overactive bladder: Secondary | ICD-10-CM

## 2013-05-12 DIAGNOSIS — N318 Other neuromuscular dysfunction of bladder: Secondary | ICD-10-CM

## 2013-05-12 DIAGNOSIS — T887XXA Unspecified adverse effect of drug or medicament, initial encounter: Secondary | ICD-10-CM

## 2013-05-12 DIAGNOSIS — L719 Rosacea, unspecified: Secondary | ICD-10-CM

## 2013-05-12 DIAGNOSIS — R030 Elevated blood-pressure reading, without diagnosis of hypertension: Secondary | ICD-10-CM

## 2013-05-12 DIAGNOSIS — M2550 Pain in unspecified joint: Secondary | ICD-10-CM

## 2013-05-12 DIAGNOSIS — E785 Hyperlipidemia, unspecified: Secondary | ICD-10-CM

## 2013-05-12 DIAGNOSIS — Z0000 Encounter for general adult medical examination without abnormal findings: Secondary | ICD-10-CM

## 2013-05-12 DIAGNOSIS — Z23 Encounter for immunization: Secondary | ICD-10-CM

## 2013-05-12 LAB — BASIC METABOLIC PANEL
CO2: 26 mEq/L (ref 19–32)
Calcium: 9.2 mg/dL (ref 8.4–10.5)
Chloride: 106 mEq/L (ref 96–112)
Creatinine, Ser: 0.8 mg/dL (ref 0.4–1.2)
Sodium: 140 mEq/L (ref 135–145)

## 2013-05-12 LAB — HEPATIC FUNCTION PANEL
Alkaline Phosphatase: 50 U/L (ref 39–117)
Bilirubin, Direct: 0.1 mg/dL (ref 0.0–0.3)
Total Bilirubin: 0.6 mg/dL (ref 0.3–1.2)
Total Protein: 7.5 g/dL (ref 6.0–8.3)

## 2013-05-12 LAB — POCT URINALYSIS DIPSTICK
Bilirubin, UA: NEGATIVE
Blood, UA: NEGATIVE
Glucose, UA: NEGATIVE
Nitrite, UA: NEGATIVE
Urobilinogen, UA: 0.2

## 2013-05-12 LAB — CBC WITH DIFFERENTIAL/PLATELET
Basophils Absolute: 0 10*3/uL (ref 0.0–0.1)
Eosinophils Relative: 2 % (ref 0.0–5.0)
HCT: 40 % (ref 36.0–46.0)
Lymphocytes Relative: 38.6 % (ref 12.0–46.0)
Monocytes Relative: 9.5 % (ref 3.0–12.0)
Neutrophils Relative %: 49.4 % (ref 43.0–77.0)
Platelets: 204 10*3/uL (ref 150.0–400.0)
RDW: 13.9 % (ref 11.5–14.6)
WBC: 7.7 10*3/uL (ref 4.5–10.5)

## 2013-05-12 LAB — TSH: TSH: 2.31 u[IU]/mL (ref 0.35–5.50)

## 2013-05-12 LAB — LIPID PANEL
HDL: 55.8 mg/dL (ref >39.00)
LDL Cholesterol: 90 mg/dL (ref 0–99)
Total CHOL/HDL Ratio: 3
Triglycerides: 47 mg/dL (ref 0.0–149.0)

## 2013-05-12 MED ORDER — CELECOXIB 200 MG PO CAPS: ORAL_CAPSULE | ORAL | Status: DC

## 2013-05-12 MED ORDER — METRONIDAZOLE 0.75 % EX CREA: TOPICAL_CREAM | Freq: Two times a day (BID) | CUTANEOUS | Status: DC

## 2013-05-12 MED ORDER — FESOTERODINE FUMARATE ER 8 MG PO TB24: ORAL_TABLET | ORAL | Status: DC

## 2013-05-12 NOTE — Addendum Note (Signed)
Addended by: Willy Eddy on: 05/12/2013 05:45 PM   Modules accepted: Orders

## 2013-05-12 NOTE — Progress Notes (Signed)
Subjective:    Patient ID: Colleen Stephens, female    DOB: November 11, 1946, 67 y.o.   MRN: [id number]  HPI  cpx  Bladder spasm and incontinence   Review of Systems  Constitutional: Negative for activity change, appetite change and fatigue.  HENT: Negative for ear pain, congestion, neck pain, postnasal drip and sinus pressure.   Eyes: Negative for redness and visual disturbance.  Respiratory: Negative for cough, shortness of breath and wheezing.   Gastrointestinal: Negative for abdominal pain and abdominal distention.  Genitourinary: Negative for dysuria, frequency and menstrual problem.  Musculoskeletal: Negative for myalgias, joint swelling and arthralgias.  Skin: Negative for rash and wound.  Neurological: Negative for dizziness, weakness and headaches.  Hematological: Negative for adenopathy. Does not bruise/bleed easily.  Psychiatric/Behavioral: Negative for sleep disturbance and decreased concentration.       Past Medical History  Diagnosis Date  . Painful respiration   . Acute bronchitis   . Adjustment disorder with depressed mood   . Bladder spasm   . GERD (gastroesophageal reflux disease)   . Headache   . Rosacea   . Allergy   . History of mitral valve prolapse     History   Social History  . Marital Status: Widowed    Spouse Name: N/A    Number of Children: N/A  . Years of Education: N/A   Occupational History  . retired    Social History Main Topics  . Smoking status: Never Smoker   . Smokeless tobacco: Not on file  . Alcohol Use: No  . Drug Use: No  . Sexually Active: Not on file   Other Topics Concern  . Not on file   Social History Narrative  . No narrative on file    Past Surgical History  Procedure Laterality Date  . Abdominal hysterectomy    . Ahand tendon repair on left hand    . Foot surgery twice    . Tubal ligation      Family History  Problem Relation Age of Onset  . Heart disease Mother   . Cancer Father     Allergies   Allergen Reactions  . Amoxicillin     REACTION: hives  . Metoclopramide Hcl     REACTION: tongue swelling    Current Outpatient Prescriptions on File Prior to Visit  Medication Sig Dispense Refill  . CELEBREX 200 MG capsule TAKE 1 CAPSULE (200 MG TOTAL) BY MOUTH 2 (TWO) TIMES DAILY.  180 capsule  2  . TOVIAZ 4 MG TB24 TAKE 1 TABLET (4 MG TOTAL) BY MOUTH DAILY.  30 tablet  6  . [DISCONTINUED] fesoterodine (TOVIAZ) 4 MG TB24 Take 1 tablet (4 mg total) by mouth daily.  30 tablet  6  . [DISCONTINUED] solifenacin (VESICARE) 10 MG tablet Take 0.5 tablets (5 mg total) by mouth daily.  90 tablet  3   No current facility-administered medications on file prior to visit.    BP 140/80  Pulse 76  Temp(Src) 98 F (36.7 C)  Resp 16  Ht 5\' 4"  (1.626 m)  Wt 164 lb (74.39 kg)  BMI 28.14 kg/m2    Objective:   Physical Exam  Nursing note and vitals reviewed. Constitutional: She is oriented to person, place, and time. She appears well-developed and well-nourished. No distress.  HENT:  Head: Normocephalic and atraumatic.  Right Ear: External ear normal.  Left Ear: External ear normal.  Nose: Nose normal.  Mouth/Throat: Oropharynx is clear and moist.  Eyes: Conjunctivae and EOM  are normal. Pupils are equal, round, and reactive to light.  Neck: Normal range of motion. Neck supple. No JVD present. No tracheal deviation present. No thyromegaly present.  Cardiovascular: Normal rate, regular rhythm, normal heart sounds and intact distal pulses.   No murmur heard. Pulmonary/Chest: Effort normal and breath sounds normal. She has no wheezes. She exhibits no tenderness.  Abdominal: Soft. Bowel sounds are normal.  Musculoskeletal: Normal range of motion. She exhibits no edema and no tenderness.  Lymphadenopathy:    She has no cervical adenopathy.  Neurological: She is alert and oriented to person, place, and time. She has normal reflexes. No cranial nerve deficit.  Skin: Skin is warm and dry. She is  not diaphoretic.  Psychiatric: She has a normal mood and affect. Her behavior is normal.          Assessment & Plan:   This is a routine physical examination for this healthy  Female. Reviewed all health maintenance protocols including mammography colonoscopy bone density and reviewed appropriate screening labs. Her immunization history was reviewed as well as her current medications and allergies refills of her chronic medications were given and the plan for yearly health maintenance was discussed all orders and referrals were made as appropriate.  Overactive bladder Increase the toviaz to 8 mg    Subjective:    Colleen Stephens is a 67 y.o. female who presents for a welcome to Medicare exam.   Cardiac risk factors: hypertension and sedentary lifestyle.  Activities of Daily Living  In your present state of health, do you have any difficulty performing the following activities?:  Preparing food and eating?: No Bathing yourself: No Getting dressed: No Using the toilet:No Moving around from place to place: No In the past year have you fallen or had a near fall?:No  Current exercise habits: The patient does not participate in regular exercise at present.   Dietary issues discussed: none   Depression Screen (Note: if answer to either of the following is "Yes", then a more complete depression screening is indicated)  Q1: Over the past two weeks, have you felt down, depressed or hopeless?no Q2: Over the past two weeks, have you felt little interest or pleasure in doing things? no   The following portions of the patient's history were reviewed and updated as appropriate: allergies, current medications, past family history, past medical history, past social history and past surgical history. Review of Systems Pertinent items are noted in HPI.    Objective:     Vision by Snellen chart: right eye:20/20, left eye:20/20 Blood pressure 140/80, pulse 76, temperature 98 F (36.7 C),  resp. rate 16, height 5\' 4"  (1.626 m), weight 164 lb (74.39 kg). Body mass index is 28.14 kg/(m^2). BP 140/80  Pulse 76  Temp(Src) 98 F (36.7 C)  Resp 16  Ht 5\' 4"  (1.626 m)  Wt 164 lb (74.39 kg)  BMI 28.14 kg/m2  General Appearance:    Alert, cooperative, no distress, appears stated age  Head:    Normocephalic, without obvious abnormality, atraumatic  Eyes:    PERRL, conjunctiva/corneas clear, EOM's intact, fundi    benign, both eyes  Ears:    Normal TM's and external ear canals, both ears  Nose:   Nares normal, septum midline, mucosa normal, no drainage    or sinus tenderness  Throat:   Lips, mucosa, and tongue normal; teeth and gums normal  Neck:   Supple, symmetrical, trachea midline, no adenopathy;    thyroid:  no enlargement/tenderness/nodules; no  carotid   bruit or JVD  Back:     Symmetric, no curvature, ROM normal, no CVA tenderness  Lungs:     Clear to auscultation bilaterally, respirations unlabored  Chest Wall:    No tenderness or deformity   Heart:    Regular rate and rhythm, S1 and S2 normal, no murmur, rub   or gallop  Breast Exam:    No tenderness, masses, or nipple abnormality  Abdomen:     Soft, non-tender, bowel sounds active all four quadrants,    no masses, no organomegaly  Genitalia:    Normal female without lesion, discharge or tenderness  Rectal:    Normal tone, normal prostate, no masses or tenderness;   guaiac negative stool  Extremities:   Extremities normal, atraumatic, no cyanosis or edema  Pulses:   2+ and symmetric all extremities  Skin:   Skin color, texture, turgor normal, no rashes or lesions  Lymph nodes:   Cervical, supraclavicular, and axillary nodes normal  Neurologic:   CNII-XII intact, normal strength, sensation and reflexes    throughout      Assessment:      This is a routine physical examination for this healthy  Female. Reviewed all health maintenance protocols including mammography colonoscopy bone density and reviewed appropriate  screening labs. Her immunization history was reviewed as well as her current medications and allergies refills of her chronic medications were given and the plan for yearly health maintenance was discussed all orders and referrals were made as appropriate.      Plan:     During the course of the visit the patient was educated and counseled about appropriate screening and preventive services including:   Pneumococcal vaccine   Influenza vaccine  Patient Instructions (the written plan) was given to the patient.

## 2013-05-12 NOTE — Patient Instructions (Signed)
Overactive Bladder, Adult The bladder has two functions that are totally opposite of the other. One is to relax and stretch out so it can store urine (fills like a balloon), and the other is to contract and squeeze down so that it can empty the urine that it has stored. Proper functioning of the bladder is a complex mixing of these two functions. The filling and emptying of the bladder can be influenced by:  The bladder.  The spinal cord.  The brain.  The nerves going to the bladder.  Other organs that are closely related to the bladder such as prostate in males and the vagina in females. As your bladder fills with urine, nerve signals are sent from the bladder to the brain to tell you that you may need to urinate. Normal urination requires that the bladder squeeze down with sufficient strength to empty the bladder, but this also requires that the bladder squeeze down sufficiently long to finish the job. In addition the sphincter muscles, which normally keep you from leaking urine, must also relax so that the urine can pass. Coordination between the bladder muscle squeezing down and the sphincter muscles relaxing is required to make everything happen normally. With an overactive bladder sometimes the muscles of the bladder contract unexpectedly and involuntarily and this causes an urgent need to urinate. The normal response is to try to hold urine in by contracting the sphincter muscles. Sometimes the bladder contracts so strongly that the sphincter muscles cannot stop the urine from passing out and incontinence occurs. This kind of incontinence is called urge incontinence. Having an overactive bladder can be embarrassing and awkward. It can keep you from living life the way you want to. Many people think it is just something you have to put up with as you grow older or have certain health conditions. In fact, there are treatments that can help make your life easier and more pleasant. CAUSES  Many  things can cause an overactive bladder. Possibilities include:  Urinary tract infection  In women, multiple pregnancies or surgery on the uterus or urethra.  Bladder stones, inflammation or tumors.  Caffeine.  Alcohol.  Medications. For example, diuretics (drugs that help the body get rid of extra fluid) increase urine production. Some other medicines must be taken with lots of fluids.  Muscle or nerve weakness. This might be the result of a spinal cord injury, a stroke, multiple sclerosis or Parkinson's disease.  Diabetes can cause a high urine volume which fills the bladder so quickly that the normal urge to urinate is triggered very strongly. SYMPTOMS   Loss of bladder control. You feel the need to urinate and cannot make your body wait.  Sudden, strong urges to urinate.  Urinating 8 or more times a day.  Waking up to urinate two or more times a night. DIAGNOSIS  To decide if you have overactive bladder, your healthcare provider will probably:  Ask about symptoms you have noticed.  Ask about your overall health. This will include questions about any medications you are taking.  Do a physical examination. This will help determine if there are obvious blockages or other problems.  Order some tests. These might include:  A blood test to check for diabetes or other health issues that could be contributing to the problem.  Urine testing. This could measure the flow of urine and the pressure on the bladder.  A test of your neurological system (the brain, spinal cord and nerves). This is the system that  senses the need to urinate. Some of these tests are called flow tests, bladder pressure tests and electrical measurements of the sphincter muscle.  A bladder test to check whether it is emptying completely when you urinate.  Cytoscopy. This test uses a thin tube with a tiny camera on it. It offers a look inside your urethra and bladder to see if there are problems.  Imaging  tests. You might be given a contrast dye and then asked to urinate. X-rays are taken to see how your bladder is working. TREATMENT  An overactive bladder can be treated in many ways. The treatment will depend on the cause. Whether you have a mild or severe case also makes a difference. Often, treatment can be given in your healthcare provider's office or clinic. Be sure to discuss the different options with your caregiver. They include:  Behavioral treatments. These do not involve medication or surgery:  Bladder training. For this, you would follow a schedule to urinate at regular intervals. This helps you learn to control the urge to urinate. At first, you might be asked to wait a few minutes after feeling the urge. In time, you should be able to schedule bathroom visits an hour or more apart.  Kegel exercises. These exercises strengthen the pelvic floor muscles, which support the bladder. By toning these muscles, they can help control urination, even if the bladder muscles are overactive. A specialist will teach you how to do these exercises correctly. They will require daily practice.  Weight loss. If you are obese or overweight, losing weight might stop your bladder from being overactive. Talk to your healthcare provider about how many pounds you should lose. Also ask if there is a specific program or method that would work best for you.  Diet change. This might be suggested if constipation is making your overactive bladder worse. Your healthcare provider or a nutritionist can explain ways to change what you eat to ease constipation. Other people might need to take in less caffeine or alcohol. Sometimes drinking fewer fluids is needed, too.  Protection. This is not an actual treatment. But, you could wear special pads to take care of any leakage while you wait for other treatments to take effect. This will help you avoid embarrassment.  Physical treatments.  Electrical stimulation. Electrodes  will send gentle pulses to the nerves or muscles that help control the bladder. The goal is to strengthen them. Sometimes this is done with the electrodes outside of the body. Or, they might be placed inside the body (implanted). This treatment can take several months to have an effect.  Medications. These are usually used along with other treatments. Several medicines are available. Some are injected into the muscles involved in urination. Others come in pill form. Medications sometimes prescribed include:  Anticholinergics. These drugs block the signals that the nerves deliver to the bladder. This keeps it from releasing urine at the wrong time. Researchers think the drugs might help in other ways, too.  Imipramine. This is an antidepressant. But, it relaxes bladder muscles.  Botox. This is still experimental. Some people believe that injecting it into the bladder muscles will relax them so they work more normally. It has also been injected into the sphincter muscle when the sphincter muscle does not open properly. This is a temporary fix, however. Also, it might make matters worse, especially in older people.  Surgery.  A device might be implanted to help manage your nerves. It works on the nerves that signal  when you need to urinate.  Surgery is sometimes needed with electrical stimulation. If the electrodes are implanted, this is done through surgery.  Sometimes repairs need to be made through surgery. For example, the size of the bladder can be changed. This is usually done in severe cases only. HOME CARE INSTRUCTIONS   Take any medications your healthcare provider prescribed or suggested. Follow the directions carefully.  Practice any lifestyle changes that are recommended. These might include:  Drinking less fluid or drinking at different times of the day. If you need to urinate often during the night, for example, you may need to stop drinking fluids early in the evening.  Cutting  down on caffeine or alcohol. They can both make an overactive bladder worse. Caffeine is found in coffee, tea and sodas.  Doing Kegel exercises to strengthen muscles.  Losing weight, if that is recommended.  Eating a healthy and balanced diet. This will help you avoid constipation.  Keep a journal or a log. You might be asked to record how much you drink and when, and also when you feel the need to urinate.  Learn how to care for implants or other devices, such as pessaries. SEEK MEDICAL CARE IF:   Your overactive bladder gets worse.  You feel increased pain or irritation when you urinate.  You notice blood in your urine.  You have questions about any medications or devices that your healthcare provider recommended.  You notice blood, pus or swelling at the site of any test or treatment procedure.  You have an oral temperature above 102 F (38.9 C). SEEK IMMEDIATE MEDICAL CARE IF:  You have an oral temperature above 102 F (38.9 C), not controlled by medicine. Document Released: 10/07/2009 Document Revised: 03/04/2012 Document Reviewed: 10/07/2009 [facility] Patient Information 2013 [place], [place].

## 2013-05-27 ENCOUNTER — Encounter: Payer: Self-pay | Admitting: Internal Medicine

## 2013-05-28 ENCOUNTER — Other Ambulatory Visit: Payer: Self-pay | Admitting: *Deleted

## 2013-05-28 DIAGNOSIS — L719 Rosacea, unspecified: Secondary | ICD-10-CM

## 2013-05-28 MED ORDER — METRONIDAZOLE 0.75 % EX CREA: TOPICAL_CREAM | Freq: Two times a day (BID) | CUTANEOUS | Status: DC

## 2013-06-11 ENCOUNTER — Telehealth: Payer: Self-pay | Admitting: Internal Medicine

## 2013-06-11 NOTE — Telephone Encounter (Signed)
Pharm needs clarification on instructions for fesoterodine (TOVIAZ) 8 MG TB24. Instructions state TAKE 1 TABLET (4 MG TOTAL) BY MOUTH DAILY.  Is that 1/2 pill??  Pls advise

## 2013-09-22 ENCOUNTER — Encounter: Payer: Self-pay | Admitting: Internal Medicine

## 2013-10-30 ENCOUNTER — Other Ambulatory Visit: Payer: Self-pay

## 2013-11-12 ENCOUNTER — Ambulatory Visit: Payer: Medicare Other | Admitting: Internal Medicine

## 2013-11-15 ENCOUNTER — Encounter: Payer: Self-pay | Admitting: Internal Medicine

## 2013-11-17 ENCOUNTER — Other Ambulatory Visit: Payer: Self-pay | Admitting: *Deleted

## 2013-11-17 MED ORDER — ALFUZOSIN HCL ER 10 MG PO TB24: 10.0000 mg | ORAL_TABLET | Freq: Every day | ORAL | Status: DC

## 2013-11-17 MED ORDER — MELOXICAM 15 MG PO TABS: 15.0000 mg | ORAL_TABLET | Freq: Every day | ORAL | Status: DC

## 2013-11-24 ENCOUNTER — Telehealth: Payer: Self-pay | Admitting: Internal Medicine

## 2013-11-24 MED ORDER — DARIFENACIN HYDROBROMIDE ER 7.5 MG PO TB24: 7.5000 mg | ORAL_TABLET | Freq: Every day | ORAL | Status: DC

## 2013-11-24 NOTE — Telephone Encounter (Signed)
Pharmacist is correct The medication that should have been called in was Enablex 15 mg one by mouth daily Please call in the correct rx Number 30 one po daily 11 refills

## 2013-11-24 NOTE — Telephone Encounter (Signed)
Rx called in to pharmacy. 

## 2013-11-24 NOTE — Telephone Encounter (Signed)
Pharmacist called in reference to pt's alfuzosin (UROXATRAL) 10 MG 24 hr tablet, he believes that this medicaiton is for men with prostate issues not women with bladder issues.  Aneta Mins wants to make sure he is not confusing this with another medication.  Could you please call him and clarify this for him.  Thanks.

## 2014-01-26 ENCOUNTER — Ambulatory Visit (INDEPENDENT_AMBULATORY_CARE_PROVIDER_SITE_OTHER): Payer: Medicare Other | Admitting: Internal Medicine

## 2014-01-26 ENCOUNTER — Encounter: Payer: Self-pay | Admitting: Internal Medicine

## 2014-01-26 VITALS — BP 156/90 | HR 76 | Temp 98.1°F | Resp 16 | Ht 64.0 in | Wt 162.0 lb

## 2014-01-26 DIAGNOSIS — M1710 Unilateral primary osteoarthritis, unspecified knee: Secondary | ICD-10-CM

## 2014-01-26 DIAGNOSIS — N3281 Overactive bladder: Secondary | ICD-10-CM

## 2014-01-26 DIAGNOSIS — I10 Essential (primary) hypertension: Secondary | ICD-10-CM

## 2014-01-26 DIAGNOSIS — IMO0002 Reserved for concepts with insufficient information to code with codable children: Secondary | ICD-10-CM

## 2014-01-26 DIAGNOSIS — N318 Other neuromuscular dysfunction of bladder: Secondary | ICD-10-CM

## 2014-01-26 MED ORDER — TOLTERODINE TARTRATE ER 4 MG PO CP24: 4.0000 mg | ORAL_CAPSULE | Freq: Every day | ORAL | Status: DC

## 2014-01-26 MED ORDER — BISOPROLOL-HYDROCHLOROTHIAZIDE 2.5-6.25 MG PO TABS: 1.0000 | ORAL_TABLET | Freq: Every day | ORAL | Status: DC

## 2014-01-26 MED ORDER — DICLOFENAC SODIUM 1 % TD GEL: 2.0000 g | Freq: Four times a day (QID) | TRANSDERMAL | Status: DC

## 2014-01-26 MED ORDER — CELECOXIB 200 MG PO CAPS: 200.0000 mg | ORAL_CAPSULE | Freq: Two times a day (BID) | ORAL | Status: DC

## 2014-01-26 NOTE — Progress Notes (Signed)
   Subjective:    Patient ID: Colleen Stephens, female    DOB: 1946/11/05, 68 y.o.   MRN: [id number]  HPI    Review of Systems     Objective:   Physical Exam        Assessment & Plan:

## 2014-01-26 NOTE — Progress Notes (Signed)
Pre visit review using our clinic review tool, if applicable. No additional management support is needed unless otherwise documented below in the visit note. 

## 2014-01-26 NOTE — Patient Instructions (Signed)
The patient is instructed to continue all medications as prescribed. Schedule followup with check out clerk upon leaving the clinic  

## 2014-01-27 ENCOUNTER — Telehealth: Payer: Self-pay | Admitting: Internal Medicine

## 2014-01-27 NOTE — Telephone Encounter (Signed)
Relevant patient education assigned to patient using Emmi. ° °

## 2014-03-02 ENCOUNTER — Encounter: Payer: Self-pay | Admitting: Internal Medicine

## 2014-03-02 MED ORDER — DARIFENACIN HYDROBROMIDE ER 15 MG PO TB24: 15.0000 mg | ORAL_TABLET | Freq: Every day | ORAL | Status: DC

## 2014-03-04 ENCOUNTER — Telehealth: Payer: Self-pay | Admitting: Internal Medicine

## 2014-03-04 DIAGNOSIS — N3281 Overactive bladder: Secondary | ICD-10-CM

## 2014-03-04 NOTE — Telephone Encounter (Signed)
Pharm called for pt b/c pt thought she was getting another med to try for her bladder. Pt sent dr Lovell SheehanJenkins an email, and the med he told her he would try her on is a current med for pt. darifenacin (ENABLEX) 15 MG 24 hr tablet Pt wants something else.

## 2014-03-06 NOTE — Telephone Encounter (Signed)
The enablix generic was to substitue for the detrol she has used in in the bast effectively but formulary and cost were an issue. Should be less expensive now  The only other option is for generic oxytrol?

## 2014-03-09 NOTE — Telephone Encounter (Signed)
Left message for pt to call back  °

## 2014-03-10 NOTE — Telephone Encounter (Signed)
Pt returning your call

## 2014-03-10 NOTE — Telephone Encounter (Signed)
Pt stated $119 for the enablex.  She went ahead and bought it and wants to know if the oxytrol is cheaper she would like that.  Please advise

## 2014-03-11 MED ORDER — OXYBUTYNIN 3.9 MG/24HR TD PTTW: 1.0000 | MEDICATED_PATCH | TRANSDERMAL | Status: DC

## 2014-03-11 NOTE — Telephone Encounter (Signed)
Tell her I sent in the patches for her to price and try

## 2014-03-11 NOTE — Telephone Encounter (Signed)
Pt aware.

## 2014-03-12 ENCOUNTER — Telehealth: Payer: Self-pay | Admitting: Internal Medicine

## 2014-03-12 NOTE — Telephone Encounter (Signed)
I spoke to [facility] about the PA request for Oxytrol and was advised that this is a non-covered medication.  Oxybutynin Chloride, Trospium Chloride Er and Gelnique are covered and will not require a PA.

## 2014-03-13 ENCOUNTER — Other Ambulatory Visit: Payer: Self-pay | Admitting: *Deleted

## 2014-03-13 MED ORDER — TROSPIUM CHLORIDE ER 60 MG PO CP24: 1.0000 | ORAL_CAPSULE | Freq: Every day | ORAL | Status: DC

## 2014-03-13 NOTE — Telephone Encounter (Signed)
New rx sent in for trospium

## 2014-07-07 ENCOUNTER — Encounter: Payer: Self-pay | Admitting: Internal Medicine

## 2014-08-26 ENCOUNTER — Encounter: Payer: Medicare Other | Admitting: Internal Medicine

## 2014-10-09 ENCOUNTER — Other Ambulatory Visit: Payer: Self-pay

## 2015-06-21 ENCOUNTER — Other Ambulatory Visit: Payer: Self-pay

## 2015-11-15 ENCOUNTER — Encounter: Payer: Self-pay | Admitting: Gastroenterology

## 2017-01-14 DIAGNOSIS — I471 Supraventricular tachycardia: Secondary | ICD-10-CM | POA: Insufficient documentation

## 2017-07-31 DIAGNOSIS — Z8679 Personal history of other diseases of the circulatory system: Secondary | ICD-10-CM | POA: Insufficient documentation

## 2017-09-25 DIAGNOSIS — G20 Parkinson's disease: Secondary | ICD-10-CM | POA: Insufficient documentation

## 2018-12-15 ENCOUNTER — Encounter: Payer: Self-pay | Admitting: Gastroenterology

## 2021-04-07 ENCOUNTER — Encounter: Payer: Self-pay | Admitting: Neurology

## 2021-04-21 NOTE — Progress Notes (Signed)
Assessment/Plan:   1.  idiopathic Parkinson's disease.  The patient has tremor, bradykinesia, rigidity and mild postural instability.  -We discussed the diagnosis as well as pathophysiology of the disease.  We discussed treatment options as well as prognostic indicators.  Patient education was provided.  -We decided to stop pramipexole ER.  She is below the starting dose of that medication.  My suspicion is she is not getting much benefit from it, but worry that she is getting potential side effects (hallucinations).  She is only on 0.75 mg ER.  -Patient is currently under dosed.  In addition, she is spitting out her medication too far.  I will have her increase her carbidopa/levodopa 25/100, so she is taking 2 tablets at 8:30 AM/2 tablets at 12:30 PM/2 tablets at 4:30 PM.  Discussed potential interactions with protein and how to take the medication properly.  -Discussed online exercise programs.  Given resources.  Discussed importance of safe, cardiovascular exercise.  -The patient asked me about CBD oil/marijuana.  Discussed literature on that as it relates to Parkinson's disease.  Not recommended by [facility] because of lack of controlled trials.  Talked about smaller trials in which marijuana helped tremor.  However, there is some data that suggests that it worsens cognition and falls.  The data also suggests that CBD oil is less effective than marijuana.  However, there have been concerns given the fact that CBD oil is unregulated and each manufacturer has different amounts of ingredient and the purity of each manufacturers ingredient has been called into question.  At this time, it is not recommended for the treatment of Parkinson's disease.  Further studies do need to be completed.  Gabapentin only q hs - 600 mg  2.  Low back pain  -Taking gabapentin, 600 mg at bedtime.  I may consider trying to decrease that in the future, but did not want to change to many things at once.  3.  MCI  -Patient  had neurocognitive testing with Dr. Lendell Caprice in 2022 demonstrating MCI.  4.  Mild anxiety/depression  -Dr. Pincus Sanes neurocognitive testing also demonstrated mild GAD/depression.  Daughter asked about medication for this.  I did not want to add any currently.  I may do that in the future, or she can talk to her primary care about that, but I was changing too many things at once to want to add more things.  We did discuss counseling, but they will need to find a resource in their area, as I am unfamiliar with counseling resources in the [place] area.  They did talk to my Child psychotherapist here today.  5.  Patient has an appointment tomorrow with [place] [facility], where she currently receives neurologic care.  She can certainly continue to receive care there.  This is a long distance for her to travel from [place].  Ultimately, she asked if she could be seen here through video in 6 months and then would come here in person in 1 year.  I have no objection to that, so long as she is doing fairly well.  Subjective:   Colleen Stephens was seen today in the movement disorders clinic for neurologic consultation at the request of [facility]  The consultation is for the evaluation of Parkinson's disease.  Records made available to me are reviewed. This patient is accompanied in the office by her child who supplements the history. I have reviewed records from [facility] neuropsychology, Dr. Lendell Caprice, as well as those from [facility] neurology, Dr.  Richman.  Patient last saw the nurse practitioner via telehealth on December 27, 2020.  Patient was complaining about some hallucinations at that visit.  Pramipexole was not changed.  The visit prior to that also appears to be telemedicine.  Patient was first diagnosed with Parkinson's disease about 5 years ago.  Her first symptom was R hand flexion with ambulation.  She had also had a fall around that time.  She went to the neurologist in [place] and  was placed on pramipexole.  She thinks that the medication was helpful.   Specific Symptoms:  Tremor: Yes.  , R hand only Family hx of similar:  No. Voice: decreased in strength per daughter Sleep: sleeps well with cpap  Vivid Dreams:  Yes.    Acting out dreams:  No. Wet Pillows: No. Postural symptoms:  Yes.    Falls?  Yes.   , last fall was in last 1-2 months.  She thinks she may fall 1-2 times per month - many times she will fall into a table or chair and may not fall all way down to ground.  Some of falls into the uneven ground outside.  Some of falls in hammock outside. Bradykinesia symptoms: slow movements and difficulty getting out of a chair Loss of smell:  No. Loss of taste:  No. Urinary Incontinence:  No. (will wear pad on long car trip) Difficulty Swallowing:  Yes.   with dry foods like bread and some bigger pills or 1/2 tablets (like the levodopa she is cutting) Handwriting, micrographia: Yes.   Trouble with ADL's:  No.  Trouble buttoning clothing: Yes.   Depression:  Yes.   Memory changes: she had neurocognitive testing with Dr. Lendell Caprice.  This was done through telehealth.  This demonstrated mild cognitive impairment, mild anxiety and mild depression.  It was also felt that this was worsened by recurrent urinary tract infection. Hallucinations:  Yes.  , only when is woken up at night usually by dogs and has to get up; has also seen people in the yard and daughter in the house, when not there (this was 1-2 years ago and with one of times she had UTI at time).   N/V:  No. Lightheaded:  No.  Syncope: No. Diplopia:  No. Dyskinesia:  No.   Neuroimaging of the brain has  previously been performed.  It is not available for my review, but I have the report..  Patient did have a June 13, 2017 MRI brain.  Indication for that was right hand tremor.  This was reported to show just mild small vessel disease.  She had a February 21, 2019 CT head that was reported to be stable, nonacute  and mild small vessel disease and mild atrophy.  PREVIOUS MEDICATIONS: pramipexole ER, 0.75 mg daily (takes at 8:30am) Carbidopa/levodopa 25/100, 1.5 tablet 4 times per day (pt only taking 3 times per day and she takes it at 8:30am/4pm/bedtime)  ALLERGIES:   Allergies  Allergen Reactions  . Amoxicillin     REACTION: hives  . Metoclopramide Hcl     REACTION: tongue swelling    CURRENT MEDICATIONS:  Current Outpatient Medications  Medication Instructions  . alendronate (FOSAMAX) 10 mg, Oral, Daily  . B Complex Vitamins (VITAMIN B-COMPLEX) TABS 1 tablet, Oral, Daily  . Carbidopa-Levodopa ER (SINEMET CR) 25-100 MG tablet controlled release Oral, Taking 1.5 tab 3 time a day.  . diclofenac sodium (VOLTAREN) 2 g, Topical, 4 times daily  . esomeprazole (NEXIUM) 40 MG capsule TAKE 1 CAPSULE(40  MG) BY MOUTH DAILY  . Estrogens, Conjugated (PREMARIN VA) 0.5 g, Vaginal, 2 times weekly  . gabapentin (NEURONTIN) 300 MG capsule Take 1 capsule by mouth at bedtime. Gradually increase to 3 times a day as tolerated.  . metroNIDAZOLE (METROCREAM) 0.75 % cream Topical, 2 times daily  . pramipexole (MIRAPEX) 0.75 MG tablet Oral, Taking 1 tab daily.    Objective:   VITALS:   Vitals:   04/25/21 1254  BP: 124/78  Pulse: 72  SpO2: 98%  Weight: 181 lb (82.1 kg)  Height: 5\' 4"  (1.626 m)    GEN:  The patient appears stated age and is in NAD. HEENT:  Normocephalic, atraumatic.  The mucous membranes are moist. The superficial temporal arteries are without ropiness or tenderness. CV:  RRR Lungs:  CTAB Neck/HEME:  There are no carotid bruits bilaterally.  Neurological examination:  Orientation: The patient is alert and oriented x3.  Cranial nerves: There is good facial symmetry. Extraocular muscles are intact. The visual fields are full to confrontational testing. The speech is fluent and clear. Soft palate rises symmetrically and there is no tongue deviation. Hearing is intact to conversational  tone. Sensation: Sensation is intact to light and pinprick throughout (facial, trunk, extremities). Vibration is intact at the bilateral big toe. There is no extinction with double simultaneous stimulation. There is no sensory dermatomal level identified. Motor: Strength is 5/5 in the bilateral upper and lower extremities.   Shoulder shrug is equal and symmetric.  There is no pronator drift. Deep tendon reflexes: Deep tendon reflexes are 2/4 at the bilateral biceps, triceps, brachioradialis, patella and achilles. Plantar responses are downgoing bilaterally.  Movement examination: Tone: There is mod increased tone in the RUE and mild in the LUE Abnormal movements: there is RUE rest tremor Coordination:  There is mod decremation with RAM's, with any form of RAMS, including alternating supination and pronation of the forearm, hand opening and closing, finger taps, heel taps and toe taps on the R Gait and Station: The patient has difficulty arising out of a deep-seated chair without the use of the hands.   She is able to do it on the 3rd attempt.  The patient's stride length is good but she has decreased arm swing bilaterally, R>L.  The patient has a positive pull test.       Total time spent on today's visit was 70 minutes, including both face-to-face time and nonface-to-face time.  Time included that spent on review of records (prior notes available to me/labs/imaging if pertinent), discussing treatment and goals, answering patient's questions and coordinating care.  Cc:  , MD

## 2021-04-22 ENCOUNTER — Ambulatory Visit: Payer: Medicare Other | Admitting: Neurology

## 2021-04-25 ENCOUNTER — Encounter: Payer: Self-pay | Admitting: Neurology

## 2021-04-25 ENCOUNTER — Other Ambulatory Visit: Payer: Self-pay

## 2021-04-25 ENCOUNTER — Ambulatory Visit (INDEPENDENT_AMBULATORY_CARE_PROVIDER_SITE_OTHER): Payer: Medicare Other | Admitting: Neurology

## 2021-04-25 VITALS — BP 124/78 | HR 72 | Ht 64.0 in | Wt 181.0 lb

## 2021-04-25 DIAGNOSIS — G3184 Mild cognitive impairment, so stated: Secondary | ICD-10-CM

## 2021-04-25 DIAGNOSIS — G20 Parkinson's disease: Secondary | ICD-10-CM

## 2021-04-25 MED ORDER — CARBIDOPA-LEVODOPA 25-100 MG PO TABS: 2.0000 | ORAL_TABLET | Freq: Three times a day (TID) | ORAL | 1 refills | Status: DC

## 2021-04-25 NOTE — Patient Instructions (Addendum)
1.  Make a schedule!  Get up at the same time every day and go to sleep at the same time every day.  No naps after 2 pm and no naps longer than 1 hour.  All naps need to be scheduled and you should awake to an alarm clock.  2.  STOP pramipexole  3.  INCREASE Carbidopa Levodopa 2 at 8:30am/12:30pm/4:30 pm:   As a reminder, carbidopa/levodopa can be taken at the same time as a carbohydrate, but we like to have you take your pill either 30 minutes before a protein source or 1 hour after as protein can interfere with carbidopa/levodopa absorption.  4.  Let us know how mood does with changes and/or follow up with PCP  Online Resources for Power over Parkinson's Group April 2022  . Local Boody Online Groups  o Power over [employer] Group :   - Power Over Parkinson's Patient Education Group will be Wednesday, April 13th at 2pm via [place].   - Upcoming Power over Parkinson's Meetings:  2nd Wednesdays of the month at 2 pm:  May 11th, June 8th - Contact Amy Marriott at [email or web address] if interested in participating in this online group o Parkinson's Care Partners Group:    3rd Mondays, Contact Misty Palladino o Atypical Parkinsonian Patient Group:   4th Wednesdays, Contact Misty Palladino o If you are interested in participating in these online groups with Misty, please contact her directly for how to join those meetings.  Her contact information is [email or web address]  She will send you a link to join the [employer].    . Parkinson Foundation:  www.parkinson.org o PD Health at Home continues:  Mindfulness Mondays, Expert Briefing Tuesdays, Wellness Wednesdays, Take Time Thursdays, Fitness Fridays -Listings for March 2022 are on the website o Upcoming Webinar:  Can We Put the Brakes on PD Progression?  Wednesday, April 6th @ 1 pm o Electronics engineer) at [email or web address] o  Please check out their website to sign up for emails and see  their full online offerings  . [employer] Foundation:  www.michaeljfox.org  o Upcoming Webinar:   New to Parkinson's?  Steps to Take Today.  Thursday, March 21st @ 12 noon o Check out additional information on their website to see their full online offerings  . March 23 Foundation:  www.davisphinneyfoundation.org o Upcoming Webinar:  Stay tuned o Care Partner Monthly Meetup.  With [employer].  First Tuesday of each month, 2 pm o Check out additional information to Live Well Today on their website  . Parkinson and Movement Disorders (PMD) Alliance:  www.pmdalliance.org o [employer] Online:  Online Education Events o Sign up for emails, which are sent weekly to give you updates on programming and online offerings     . Parkinson's Association of the [place]:  www.parkinsonassociation.org o Information on online support groups, education events, and online exercises including Yoga, Parkinson's exercises and more-LOTS of information on links to PD resources and online events o Virtual Support Group through Parkinson's Association of the [place]; next one is scheduled for Wednesday, March 30, 2021 at 2 pm. (These are typically scheduled for the 1st Wednesday of the month at 2 pm).  Visit website for details.  . Additional links for movement activities: o PWR! Moves Classes at [facility] Exercise Room HAVE RESUMED!  Wednesdays 10 and 11 am.  Contact Amy Marriott, PT [email or web address] or [phone] if interested o Here is a link to the PWR!Moves classes on Zoom from [phone] -  Daily Mon-Sat at 10:00. Via Zoom, FREE and open to all.  There is also a link below via Facebook if you use that platform. - [email or web address].is - [email or web address] o Parkinson's  Wellness Recovery (PWR! Moves)  www.pwr4life.org - Info on the PWR! Virtual Experience:  You will have access to our expertise through self-assessment, guided plans that start with the PD-specific fundamentals, educational content, tips, Q&A with an expert, and a growing Engineering geologist of PD-specific pre-recorded and live exercise classes of varying types and intensity - both physical and cognitive! If that is not enough, we offer 1:1 wellness consultations (in-person or virtual) to personalize your PWR! Dance movement psychotherapist.  - Check out the PWR! Move of the month on the Parkinson Wellness Recovery website:  [email or web address].de o [employer]  Fridays:  - As part of the PD Health @ Home program, this free video series focuses each week on one aspect of fitness designed to support people living with Parkinson's.  These weekly videos highlight the Parkinson Foundation recent fitness guidelines for people with Parkinson's disease. -  http://www.morris.com/ o Dance for PD website is offering free, live-stream classes throughout the week, as well as links to [employer] of classes:  [email or web address]://danceforparkinsons.[email or web address]/ o Dance for Parkinson's Class:  [facility]ance [employer] of [place].  Free offering for people with Parkinson's and care partners; virtual class.  o For more information, contact [phone] or email Allena Napoleon at [email or web address] o Virtual dance and Pilates for Parkinson's classes: Click on the Community Tab> Parkinson's Movement Initiative Tab.  To register for classes and for more information, visit www.[email or web address].co.za and click the "community" tab.     o [employer] Parkinson's Cycling Classes  - [facility]: 1pm on Fridays-Live classes at [facility] [employer] at [email or web address] or [phone]) [employer]: Virtual Classes Mondays and Thursdays  (contact [place] at [email or web address] or ([phone])  o [facility] Boxing - Three levels of classes are offered Tuesdays and Thursdays:  10:30 am,  12 noon & 1:45 pm at [facility]  - Active Stretching with Lorenda Peck Class starting in March, on Fridays - To observe a class or for  more information, call [phone] or email [email or web address] . [employer]: o Financial trader Opportunities:  www.well-springsolutions.org/caregiver-education/caregiver-support-group.  You may also contact Loleta Chance at [email or web address] or [phone].   o Well-Spring Navigator:  Just1Navigator program, a free service to help individuals and families through the journey of determining care for older adults.  The "Navigator" is a [phone], Child psychotherapist, who will speak with a prospective client and/or loved ones to provide an assessment of the situation and a set of recommendations for a personalized care plan -- all free of charge, and whether [facility] [employer] offers the needed service or not. If the need is not a service we provide, we are well-connected with reputable programs in town that we can refer you to.  www.well-springsolutions.org or to speak with the Navigator, call [phone].

## 2021-08-26 ENCOUNTER — Telehealth: Payer: Self-pay | Admitting: Neurology

## 2021-08-26 NOTE — Telephone Encounter (Signed)
Patient daughter would like a call back about this, patient has been added to the wait list and daughter is aware of that but still would like to speak to someone

## 2021-08-26 NOTE — Telephone Encounter (Signed)
Called patients daughter back and explained why Dr. Arbutus Leas wants to see her in person in order to adjust her moms medication. Patient understood explanation of movement disorder and how Dr. Arbutus Leas needs to visually see what is happening with the patient before making an adjustment of this kind. Daughter completely understood and had no more questions at this time and will be waiting on a CX call

## 2021-08-26 NOTE — Telephone Encounter (Signed)
Pt's daughter called in stating her right hand tremor is worse at night They are wondering if she should take an additional dose of the carbidopa-levodopa before bed? The daughter mentioned she believes the patient is waking up to go to the restroom at night and noticing the tremor.

## 2021-10-25 NOTE — Progress Notes (Signed)
Virtual Visit Via Video   The purpose of this virtual visit is to provide medical care while limiting exposure to the novel coronavirus.    Consent was obtained for video visit:  Yes.   Answered questions that patient had about telehealth interaction:  Yes.   I discussed the limitations, risks, security and privacy concerns of performing an evaluation and management service by telemedicine (>50% via video, had to call her for the audio). I also discussed with the patient that there may be a patient responsible charge related to this service. The patient expressed understanding and agreed to proceed.  Pt location: Home Physician Location: office Name of referring provider:  [name], MD I connected with Colleen Stephens at patients initiation/request on 10/26/2021 at 11:15 AM EDT by video enabled telemedicine application and verified that I am speaking with the correct person using two identifiers. Pt MRN:  [id number] Pt DOB:  02-21-1946 Video Participants:  Colleen Stephens;    Assessment/Plan:   1.  Parkinson's disease  -Continue carbidopa/levodopa 25/100, 2 tablets at 8:30 AM/12:30 PM/4:30 PM  -add carbidopa/levodopa 50/200 CR at bed for nighttime tremor and turning over in the bed  2.  Low back pain  -PCP has on gabapentin, 600 mg q hs.  May need to continue decreasing due to mild hallucination (better off of pramipexole)  3.  MCI  -Neurocognitive testing with Dr. Lendell Caprice in 2022 with evidence of MCI only  4.  Mild anxiety/depression  -start lexapro, 10 mg daily  Subjective   Patient seen today in follow-up.  Last seen in May.  That was my first visit with the patient.  At that point in time, I stopped the patient's pramipexole because of hallucinations.  We increased her levodopa.  She reports today that she is doing "really well."  Some nights when going to bed, she is having shaking of the hands at bed.  She goes to bed at 9pm.  She does have trouble turning over in the  bed at night - "its like I have a weight on me."  She states that she rarely has a hallucination now and if so, its at late evening.  She occasionally will hear something like a low TV in another room, but there is no TV on in another room. She asks about med for mood.  Would like to feel "like I used to."   I have reviewed records from her primary care physician from September 26.  Current movement d/o meds: Carbidopa/levodopa 25/100, 2 tablets at 8:30 AM/2 tablets at 12:30 PM/2 tablets at 4:30 PM   Prior medications: Pramipexole ER, 0.75 mg daily (discontinued because of hallucinations)   Current Outpatient Medications on File Prior to Visit  Medication Sig Dispense Refill   alendronate (FOSAMAX) 10 MG tablet Take 10 mg by mouth daily.     B Complex Vitamins (VITAMIN B-COMPLEX) TABS Take 1 tablet by mouth daily.     carbidopa-levodopa (SINEMET IR) 25-100 MG tablet Take 2 tablets by mouth 3 (three) times daily. 8:30am/12:30pm/4:30pm 540 tablet 1   diclofenac sodium (VOLTAREN) 1 % GEL Apply 2 g topically 4 (four) times daily. (Patient not taking: Reported on 04/25/2021) 100 g 11   esomeprazole (NEXIUM) 40 MG capsule TAKE 1 CAPSULE(40 MG) BY MOUTH DAILY     Estrogens, Conjugated (PREMARIN VA) Place 0.5 g vaginally 2 (two) times a week.     gabapentin (NEURONTIN) 300 MG capsule Take 1 capsule by mouth at bedtime. Gradually increase  to 3 times a day as tolerated.     metroNIDAZOLE (METROCREAM) 0.75 % cream Apply topically 2 (two) times daily.     No current facility-administered medications on file prior to visit.     Objective   There were no vitals filed for this visit. GEN:  The patient appears stated age and is in NAD.  Neurological examination:  Orientation: The patient is alert and oriented x3. Cranial nerves: There is good facial symmetry. There is min facial hypomimia.  The speech is fluent and clear. Soft palate rises symmetrically and there is no tongue deviation. Hearing is  intact to conversational tone. Motor: Strength is at least antigravity x 4.   Shoulder shrug is equal and symmetric.  There is no pronator drift.  Movement examination: Tone: unable Abnormal movements: none seen Coordination:  There is mild decremation with RAM's, with finger taps bilaterally Gait and Station: pt unable to position camera for me to see her walk  I have reviewed and interpreted the following labs independently She labs on September 26 from primary care.  Sodium was 140, potassium 4.2, chloride 106, CO2 27, BUN 15, creatinine 0.82.  Follow up Instructions      -I discussed the assessment and treatment plan with the patient. The patient was provided an opportunity to ask questions and all were answered. The patient agreed with the plan and demonstrated an understanding of the instructions.   The patient was advised to call back or seek an in-person evaluation if the symptoms worsen or if the condition fails to improve as anticipated.    Total time spent on today's visit was , including both face-to-face time and nonface-to-face time.  Time included that spent on review of records (prior notes available to me/labs/imaging if pertinent), discussing treatment and goals, answering patient's questions and coordinating care.   Kerin Salen, DO

## 2021-10-26 ENCOUNTER — Encounter: Payer: Self-pay | Admitting: Neurology

## 2021-10-26 ENCOUNTER — Telehealth (INDEPENDENT_AMBULATORY_CARE_PROVIDER_SITE_OTHER): Payer: Medicare Other | Admitting: Neurology

## 2021-10-26 ENCOUNTER — Other Ambulatory Visit: Payer: Self-pay

## 2021-10-26 VITALS — Wt 174.0 lb

## 2021-10-26 DIAGNOSIS — G20 Parkinson's disease: Secondary | ICD-10-CM

## 2021-10-26 DIAGNOSIS — F330 Major depressive disorder, recurrent, mild: Secondary | ICD-10-CM

## 2021-10-26 MED ORDER — ESCITALOPRAM OXALATE 10 MG PO TABS: 10.0000 mg | ORAL_TABLET | Freq: Every day | ORAL | 1 refills | Status: DC

## 2021-10-26 MED ORDER — CARBIDOPA-LEVODOPA ER 50-200 MG PO TBCR: 1.0000 | EXTENDED_RELEASE_TABLET | Freq: Every day | ORAL | 1 refills | Status: DC

## 2021-10-26 MED ORDER — CARBIDOPA-LEVODOPA 25-100 MG PO TABS: 2.0000 | ORAL_TABLET | Freq: Three times a day (TID) | ORAL | 1 refills | Status: DC

## 2021-11-07 ENCOUNTER — Other Ambulatory Visit: Payer: Self-pay

## 2021-11-07 ENCOUNTER — Telehealth: Payer: Self-pay | Admitting: Neurology

## 2021-11-07 DIAGNOSIS — G20 Parkinson's disease: Secondary | ICD-10-CM

## 2021-11-07 MED ORDER — CARBIDOPA-LEVODOPA 25-100 MG PO TABS: 2.0000 | ORAL_TABLET | Freq: Four times a day (QID) | ORAL | 0 refills | Status: DC

## 2021-11-07 NOTE — Telephone Encounter (Addendum)
Patient did let me know her hallucinations are not happening when she is awake only when she is asleep indicating the Vivid dreaming. Patient agreed to call PCP to decrease Gabapentin and get screened for UTI I have changed patients medication to indicate to stop taking the 50/200 Levodopa and add to her night time regime a 25/100 and send in a prescription for the medication so she will not run out. Patient also aware that she needs to hold off on the Lexapro for now

## 2021-11-07 NOTE — Telephone Encounter (Signed)
Pt medication was changed to carbidopa/levo 50mg  and since then she has had 3 nights of hallucinations.

## 2021-11-07 NOTE — Telephone Encounter (Signed)
Pt called with c/o:  confusion/hallucinations New medications?  Yes.   When did they start?  This week If hallucinations are new, has patient been checked for infection, including UTI?  No. Current medications prescribed by Dr. Arbutus Leas and TIMES taking the medications: Carbidopa Levodopa 50/200 at bedtime has not started the Lexapro should she start the anxiety medication

## 2021-11-07 NOTE — Telephone Encounter (Signed)
Called patient back and let her know Dr. Jenel Lucks is unable to see the films at [facility]

## 2021-11-07 NOTE — Telephone Encounter (Signed)
Patient has had a CT Scan done as well and wanted to know if you are able tot see the results it was while she was in the hospital

## 2022-04-24 NOTE — Progress Notes (Deleted)
Assessment/Plan:    1.  Parkinson's disease             -Continue carbidopa/levodopa 25/100, 2 tablets at 8:30 AM/12:30 PM/4:30 PM             -Continue carbidopa/levodopa 25/100 CR at bed for nighttime tremor and turning over in the bed   2.  Low back pain             -PCP has on gabapentin, 600 mg q hs.  May need to continue decreasing due to mild hallucination (better off of pramipexole)   3.  MCI             -Neurocognitive testing with Dr. Lendell Caprice in 2022 with evidence of MCI only   4.  Mild anxiety/depression             -Continue lexapro, 10 mg daily  Subjective:   Colleen Stephens was seen today in follow up for Parkinsons disease.  My previous records were reviewed prior to todays visit as well as outside records available to me.  Last visit was a video visit and we added extended release levodopa for nighttime tremor and turning over in the bed.  She called not long thereafter and initially stated that she was having hallucinations, but this turned out to be vivid dreams upon further questioning.  We ended up telling her to take carbidopa/levodopa 25/100 CR at bedtime instead of carbidopa/levodopa 50/200 CR at bedtime.  Pt denies falls.  Pt denies lightheadedness, near syncope.  No hallucinations.  We started her on Lexapro last visit and she reports mood is ***.   she had her Medicare annual wellness visit on February 27.  Notes are reviewed.  Current prescribed movement disorder medications: ***carbidopa/levodopa 25/100, 2 tablets at 8:30 AM/12:30 PM/4:30 PM Carbidopa/levodopa 25/100 CR at bedtime   PREVIOUS MEDICATIONS: pramipexole ER, 0.75 mg daily (d/c d/t hallucination)  ALLERGIES:   Allergies  Allergen Reactions   Amoxicillin     REACTION: hives   Metoclopramide Hcl     REACTION: tongue swelling    CURRENT MEDICATIONS:  No outpatient medications have been marked as taking for the 04/25/22 encounter (Appointment) with Jack Mineau, Octaviano Batty, DO.     Objective:    PHYSICAL EXAMINATION:    VITALS:  There were no vitals filed for this visit.  GEN:  The patient appears stated age and is in NAD. HEENT:  Normocephalic, atraumatic.  The mucous membranes are moist. The superficial temporal arteries are without ropiness or tenderness. CV:  RRR Lungs:  CTAB Neck/HEME:  There are no carotid bruits bilaterally.  Neurological examination:  Orientation: The patient is alert and oriented x3. Cranial nerves: There is good facial symmetry with*** facial hypomimia. The speech is fluent and clear. Soft palate rises symmetrically and there is no tongue deviation. Hearing is intact to conversational tone. Sensation: Sensation is intact to light touch throughout Motor: Strength is at least antigravity x4.  Movement examination: Tone: There is mod increased tone in the RUE and mild in the LUE Abnormal movements: there is RUE rest tremor Coordination:  There is mod decremation with RAM's, with any form of RAMS, including alternating supination and pronation of the forearm, hand opening and closing, finger taps, heel taps and toe taps on the R Gait and Station: The patient has difficulty arising out of a deep-seated chair without the use of the hands.   She is able to do it on the 3rd attempt.  The patient's  stride length is good but she has decreased arm swing bilaterally, R>L.  The patient has a positive pull test.     I have reviewed and interpreted the following labs independently    Chemistry      Component Value Date/Time   NA 140 05/12/2013 0806   K 4.4 05/12/2013 0806   CL 106 05/12/2013 0806   CO2 26 05/12/2013 0806   BUN 23 05/12/2013 0806   CREATININE 0.8 05/12/2013 0806      Component Value Date/Time   CALCIUM 9.2 05/12/2013 0806   ALKPHOS 50 05/12/2013 0806   AST 26 05/12/2013 0806   ALT 27 05/12/2013 0806   BILITOT 0.6 05/12/2013 0806       Lab Results  Component Value Date   WBC 7.7 05/12/2013   HGB 13.6 05/12/2013   HCT 40.0  05/12/2013   MCV 88.1 05/12/2013   PLT 204.0 05/12/2013    Lab Results  Component Value Date   TSH 2.31 05/12/2013     Total time spent on today's visit was ***30 minutes, including both face-to-face time and nonface-to-face time.  Time included that spent on review of records (prior notes available to me/labs/imaging if pertinent), discussing treatment and goals, answering patient's questions and coordinating care.  Cc:  [name], MD

## 2022-04-25 ENCOUNTER — Ambulatory Visit: Payer: Medicare Other | Admitting: Neurology

## 2022-05-10 ENCOUNTER — Other Ambulatory Visit: Payer: Self-pay | Admitting: Neurology

## 2022-05-21 ENCOUNTER — Other Ambulatory Visit: Payer: Self-pay | Admitting: Neurology

## 2022-05-21 DIAGNOSIS — G20 Parkinson's disease: Secondary | ICD-10-CM
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
## 2022-06-07 ENCOUNTER — Ambulatory Visit (INDEPENDENT_AMBULATORY_CARE_PROVIDER_SITE_OTHER): Payer: Medicare Other | Admitting: Neurology

## 2022-06-07 ENCOUNTER — Encounter: Payer: Self-pay | Admitting: Neurology

## 2022-06-07 VITALS — BP 112/60 | HR 62 | Ht 62.0 in | Wt 158.4 lb

## 2022-06-07 DIAGNOSIS — G20 Parkinson's disease: Secondary | ICD-10-CM

## 2022-06-07 DIAGNOSIS — F330 Major depressive disorder, recurrent, mild: Secondary | ICD-10-CM | POA: Diagnosis not present

## 2022-06-07 MED ORDER — ESCITALOPRAM OXALATE 10 MG PO TABS: 10.0000 mg | ORAL_TABLET | Freq: Every day | ORAL | 1 refills | Status: DC

## 2022-06-07 MED ORDER — CARBIDOPA-LEVODOPA 25-100 MG PO TABS: ORAL_TABLET | ORAL | 0 refills | Status: DC

## 2022-06-07 MED ORDER — CARBIDOPA-LEVODOPA ER 25-100 MG PO TBCR: 1.0000 | EXTENDED_RELEASE_TABLET | Freq: Every day | ORAL | 1 refills | Status: DC

## 2022-06-07 NOTE — Patient Instructions (Signed)
Take carbidopa/levodopa 25/100, 2 at 8:30am, 12:30 pm, 4:30 pm.  STOP the nighttime dosage of this one (the yellow version)  START carbidopa/levodopa 25/100 CR (or may say ER), 1 at bedtime  Local and Online Resources for Power over Parkinson's Group June 2023  LOCAL [place] PARKINSON'S GROUPS  Power over Parkinson's Group:   Power Over Parkinson's Patient Education Group will be Wednesday, June 14th-*Hybrid meting*- in person at [facility] location and via [facility] at 2:00 pm.   Upcoming [employer] over [employer] Meetings:  2nd Wednesdays of the month at 2 pm:   June 14th, July 12th Contact Amy Marriott at [email or web address] if interested in participating in this group Parkinson's Care Partners Group:    3rd Mondays, Contact Misty Paladino Atypical Parkinsonian Patient Group:   4th Wednesdays, Contact Misty Paladino If you are interested in participating in these groups with Misty, please contact her directly for how to join those meetings.  Her contact information is [email or web address].    LOCAL EVENTS AND NEW OFFERINGS Dance Class for People with Parkinson's at [facility].  Friday, June 9th at 2 pm.  Nolen Mu by Sherrie Sport DPT students.  Contact [email or web address] to register or with questions. Ice Cream Social at [place]!  Thursday, June 15th, 5:30-7:00 pm.  RSVP to [email or web address] for attendance and free ice cream. Parkinson's T-shirts for sale!  Designed by a local group member, with funds going to Movement Disorders Fund.  $25.00  Technical sales engineer to purchase  [employer]! Moves [employer] Instructor-Led Class offering at [employer]!  Wednesdays 1-2 pm, starting April 12th.   Contact Irma Newness, Visual merchandiser at [employer].  [email or web address]  ONLINE EDUCATION AND SUPPORT Parkinson Foundation:  www.parkinson.org PD Health at Home continues:  Mindfulness Mondays, Wellness Wednesdays, Fitness Fridays  Upcoming Education: Parkinson's  101:  What You and Your Family Should Know.  Wednesday, June 7th at 1:00 pm Register for expert briefings (webinars) at [email or web address] Please check out their website to sign up for emails and see their full online offerings   [employer] Foundation:  www.[email or web address]  Third Thursday Webinars:  On the third Thursday of every month at 12 p.m. ET, join our free live webinars to learn about various aspects of living with Parkinson's disease and our work to speed medical breakthroughs. Upcoming Webinar: REPLAY:  From Low Blood Pressure to Bladder Problems:  A Look at Lesser Known Parkinson's Symptoms.  Thursday, June 15th at 12 noon. Check out additional information on their website to see their full online offerings  [facility]:  [email or web address] Upcoming Webinar:   Stay tuned Webinar Series:  Living with Parkinson's Meetup.   Third Thursdays each month, 3 pm Care Partner Monthly Meetup.  With Jillene Bucks [place].  First Tuesday of each month, 2 pm Check out additional information to Live Well Today on their website  Parkinson and Movement Disorders (PMD) Alliance:  www.pmdalliance.org [employer] Online:  Online Education Events Sign up for emails, which are sent weekly to give you updates on programming and online offerings  Parkinson's Association of the [place]:  www.parkinsonassociation.org Information on online support groups, education events, and online exercises including Yoga, Parkinson's exercises and more-LOTS of information on links to PD resources and online events Virtual Support Group through Parkinson's Association of the [place]; next one is scheduled for Wednesday, June 7th at 2 pm. (These are typically scheduled for the 1st Wednesday of the month at 2 pm).  Visit website for details. Save the date for "Caring for Parkinson's-Caring for You", 9th Annual  Symposium.   In-person event in [place].  September 9th.  More info on registration to come. MOVEMENT AND EXERCISE OPPORTUNITIES PWR! Moves Classes at [facility] Exercise Room.  Wednesdays 10 and 11 am.   Contact Amy Marriott, PT [email or web address] if interested. NEW PWR! Moves Class offering at [employer].  Wednesdays 1-2 pm, starting April 12th.  Contact Irma Newness, Visual merchandiser at [employer].  [email or web address] Here is a link to the PWR!Moves classes on Zoom from [place] - Daily Mon-Sat at 10:00. Via Zoom, FREE and open to all.  There is also a link below via Facebook if you use that platform.  [email or web address].is [email or web address]  Parkinson's Wellness Recovery (PWR! Moves)  www.pwr4life.org Info on the PWR! Virtual Experience:  You will have access to our expertise through self-assessment, guided plans that start with the PD-specific fundamentals, educational content, tips, Q&A with an expert, and a growing Engineering geologist of PD-specific pre-recorded and live exercise classes of varying types and intensity - both physical and cognitive! If that is not enough, we offer 1:1 wellness consultations (in-person or virtual) to personalize your PWR! Dance movement psychotherapist.  Parkinson [employer] Fridays:  As part of the PD Health @ Home program, this free video series focuses each week on one aspect of fitness designed to support people living with Parkinson's.  These weekly videos highlight the Parkinson Foundation recent fitness guidelines for people with Parkinson's disease. MenusLocal.com.br Dance for PD website is offering free, live-stream classes throughout the week, as well as links to [employer] of classes:   [email or web address]/ Virtual dance and Pilates for Parkinson's classes: Click on the Community Tab> Parkinson's Movement Initiative Tab.  To register for classes and for more information, visit www.[email or web address].co.za and click the "community" tab.  [employer] Parkinson's Cycling Classes  [facility]:  Thursdays @ Noon-Live classes at [employer] ([employer] at [email or web address] or [phone]) [facility] [employer]: Virtual Classes Mondays and Thursdays Fawn Kirk classes Tuesday, Wednesday and Thursday (contact Kaanapali at [email or web address]  or [phone]) [facility] Boxing Varied levels of classes are offered Tuesdays and Thursdays at [facility].  Stretching with Byrd Hesselbach weekly class is also offered for people with Parkinson's To observe a class or for more information, call [phone] or email Patricia Nettle at [email or web address] ADDITIONAL SUPPORT AND RESOURCES Well[employer]:Online Caregiver Education Opportunities:  www.well-springsolutions.org/caregiver-education/caregiver-support-group.  You may also contact Loleta Chance at [email or web address] or [phone].    Well-Spring Navigator:  [phone] program, a free service to help individuals and families through the journey of determining care for older adults.  The "Navigator" is a [employer], Child psychotherapist, who will speak with a prospective client and/or loved ones to provide an assessment of the situation and a set of recommendations for a personalized care plan -- all free of charge, and whether Well[employer] offers the needed service or not. If the need is not a service we provide, we are well-connected with reputable programs in town that we can refer you to.  www.well-springsolutions.org or to speak with the Navigator, call [phone]. Family Caregiver Programming in June:  [employer], Thursday, June 15th 11-12:30 at June 17. [address].  Call [phone] to register

## 2022-07-25 ENCOUNTER — Other Ambulatory Visit: Payer: Self-pay | Admitting: Neurology

## 2022-07-25 DIAGNOSIS — G20 Parkinson's disease: Secondary | ICD-10-CM

## 2022-10-18 ENCOUNTER — Other Ambulatory Visit: Payer: Self-pay | Admitting: Neurology

## 2022-10-18 DIAGNOSIS — G20A1 Parkinson's disease without dyskinesia, without mention of fluctuations: Secondary | ICD-10-CM

## 2022-10-28 ENCOUNTER — Other Ambulatory Visit: Payer: Self-pay | Admitting: Neurology

## 2022-10-28 DIAGNOSIS — G20A1 Parkinson's disease without dyskinesia, without mention of fluctuations: Secondary | ICD-10-CM

## 2022-11-08 ENCOUNTER — Other Ambulatory Visit: Payer: Self-pay | Admitting: Neurology

## 2022-12-19 ENCOUNTER — Other Ambulatory Visit: Payer: Self-pay | Admitting: Neurology

## 2022-12-19 DIAGNOSIS — G20A1 Parkinson's disease without dyskinesia, without mention of fluctuations: Secondary | ICD-10-CM

## 2022-12-27 NOTE — Progress Notes (Unsigned)
Virtual Visit Via Video       Consent was obtained for video visit:  {yes no:314532} Answered questions that patient had about telehealth interaction:  {yes no:314532} I discussed the limitations, risks, security and privacy concerns of performing an evaluation and management service by telemedicine. I also discussed with the patient that there may be a patient responsible charge related to this service. The patient expressed understanding and agreed to proceed.  Pt location: Home Physician Location: office Name of referring provider:  Ricard Dillon, MD I connected with Wayna Chalet Laham at patients initiation/request on 12/28/2022 at 11:15 AM EST by video enabled telemedicine application and verified that I am speaking with the correct person using two identifiers. Pt MRN:  [id number] Pt DOB:  01-27-1946 Video Participants:  Wayna Chalet Wahid;  ***   Assessment/Plan:   1.  Parkinson's disease             -Continue carbidopa/levodopa 25/100, 2 tablets at 8:30 AM/12:30 PM/4:30 PM.  She will stop the carbidopa/levodopa 25/100 at bed             -*** carbidopa/levodopa 25/100 CR at bed for nighttime tremor and turning over in the bed.     2.  MCI             -Neurocognitive testing with Dr. Conley Canal in 2022 with evidence of MCI only   3.  Mild anxiety/depression             -Continue lexapro, 10 mg daily.    4..Frequent urinary incontinence with history of multiple UTIs  -Has followed with urology and urogynecology in the past.  Has had multiple interventions, including surgical interventions.  She has had bladder Botox.  She has been on Myrbetriq, which apparently caused hallucinations (very unusual).  Discussed with them that while Parkinson certainly can cause urinary incontinence, this discussion for any further intervention would really need to be done with the urologist.  We did mention gemtysa, but also talked about the fact that it can be very costly.  5.  I will plan on seeing  the patient in about 6 months.  She does every other visit via video.  Subjective:   NYELLA ECKELS was seen today in follow up for Parkinsons disease.  My previous records were reviewed prior to todays visit as well as outside records available to me.  She saw her primary nurse practitioner on November 30.  Notes are reviewed.  She saw her cardiologist, Dr. Rolena Infante, on December 14.  She is doing good from a cardiac standpoint.  Current prescribed movement disorder medications: carbidopa/levodopa 25/100, 2 tablets at 8:30 AM/12:30 PM/4:30 PM Carbidopa/levodopa 25/100 CR at bedtime  Lexapro, 10 mg daily   PREVIOUS MEDICATIONS: pramipexole ER, 0.75 mg daily (d/c d/t hallucination); carbidopa/levodopa 50/200 CR (thought it caused vivid dreams    ALLERGIES:   Allergies  Allergen Reactions   Amoxicillin     REACTION: hives   Metoclopramide Hcl     REACTION: tongue swelling    CURRENT MEDICATIONS:  No outpatient medications have been marked as taking for the 12/28/22 encounter (Appointment) with Veronique Warga, Eustace Quail, DO.     Objective:   PHYSICAL EXAMINATION:    VITALS:   There were no vitals filed for this visit.   GEN:  The patient appears stated age and is in NAD. HEENT:  Normocephalic, atraumatic.    Neurological examination:  Orientation: The patient is alert and oriented x3. Cranial nerves: There is good  facial symmetry with minimal facial hypomimia. The speech is fluent and clear. Hearing is intact to conversational tone. Motor: Strength is at least antigravity x4.  Movement examination: Abnormal movements: ***there is no rest tremor today.  There is dyskinesia of the right leg, mild Coordination:  There is very minimal decremation today of rapid alternating movements.   Gait and Station: The patient easily arises out of the chair and walks well.    I have reviewed and interpreted the following labs independently    Chemistry      Component Value Date/Time   NA 140  05/12/2013 0806   K 4.4 05/12/2013 0806   CL 106 05/12/2013 0806   CO2 26 05/12/2013 0806   BUN 23 05/12/2013 0806   CREATININE 0.8 05/12/2013 0806      Component Value Date/Time   CALCIUM 9.2 05/12/2013 0806   ALKPHOS 50 05/12/2013 0806   AST 26 05/12/2013 0806   ALT 27 05/12/2013 0806   BILITOT 0.6 05/12/2013 0806       Lab Results  Component Value Date   WBC 7.7 05/12/2013   HGB 13.6 05/12/2013   HCT 40.0 05/12/2013   MCV 88.1 05/12/2013   PLT 204.0 05/12/2013    Lab Results  Component Value Date   TSH 2.31 05/12/2013    Follow up Instructions      -I discussed the assessment and treatment plan with the patient. The patient was provided an opportunity to ask questions and all were answered. The patient agreed with the plan and demonstrated an understanding of the instructions.   The patient was advised to call back or seek an in-person evaluation if the symptoms worsen or if the condition fails to improve as anticipated.    Total time spent on today's visit was ***minutes, including both face-to-face time and nonface-to-face time.  Time included that spent on review of records (prior notes available to me/labs/imaging if pertinent), discussing treatment and goals, answering patient's questions and coordinating care.   Alonza Bogus, DO   Cc:  Ricard Dillon, MD

## 2022-12-28 ENCOUNTER — Telehealth (INDEPENDENT_AMBULATORY_CARE_PROVIDER_SITE_OTHER): Payer: Medicare Other | Admitting: Neurology

## 2022-12-28 DIAGNOSIS — R443 Hallucinations, unspecified: Secondary | ICD-10-CM

## 2022-12-28 DIAGNOSIS — G20A1 Parkinson's disease without dyskinesia, without mention of fluctuations: Secondary | ICD-10-CM

## 2022-12-28 DIAGNOSIS — F330 Major depressive disorder, recurrent, mild: Secondary | ICD-10-CM

## 2022-12-28 MED ORDER — ESCITALOPRAM OXALATE 20 MG PO TABS: 20.0000 mg | ORAL_TABLET | Freq: Every day | ORAL | 1 refills | Status: DC

## 2022-12-30 ENCOUNTER — Other Ambulatory Visit: Payer: Self-pay | Admitting: Neurology

## 2022-12-30 DIAGNOSIS — G20A1 Parkinson's disease without dyskinesia, without mention of fluctuations: Secondary | ICD-10-CM

## 2023-01-01 ENCOUNTER — Other Ambulatory Visit: Payer: Self-pay

## 2023-01-01 MED ORDER — ESCITALOPRAM OXALATE 20 MG PO TABS: 20.0000 mg | ORAL_TABLET | Freq: Every day | ORAL | 1 refills | Status: DC

## 2023-02-21 ENCOUNTER — Other Ambulatory Visit: Payer: Self-pay | Admitting: Neurology

## 2023-02-21 DIAGNOSIS — G20A1 Parkinson's disease without dyskinesia, without mention of fluctuations: Secondary | ICD-10-CM

## 2023-03-03 ENCOUNTER — Other Ambulatory Visit: Payer: Self-pay | Admitting: Neurology

## 2023-03-03 DIAGNOSIS — G20A1 Parkinson's disease without dyskinesia, without mention of fluctuations: Secondary | ICD-10-CM

## 2023-04-17 ENCOUNTER — Other Ambulatory Visit: Payer: Self-pay

## 2023-04-17 ENCOUNTER — Encounter: Payer: Self-pay | Admitting: Neurology

## 2023-04-17 DIAGNOSIS — G3184 Mild cognitive impairment, so stated: Secondary | ICD-10-CM

## 2023-04-17 DIAGNOSIS — G20A1 Parkinson's disease without dyskinesia, without mention of fluctuations: Secondary | ICD-10-CM

## 2023-04-17 DIAGNOSIS — R443 Hallucinations, unspecified: Secondary | ICD-10-CM

## 2023-04-24 ENCOUNTER — Other Ambulatory Visit: Payer: Self-pay | Admitting: Neurology

## 2023-04-24 DIAGNOSIS — G20A1 Parkinson's disease without dyskinesia, without mention of fluctuations: Secondary | ICD-10-CM

## 2023-05-05 ENCOUNTER — Other Ambulatory Visit: Payer: Self-pay | Admitting: Neurology

## 2023-05-05 DIAGNOSIS — G20A1 Parkinson's disease without dyskinesia, without mention of fluctuations: Secondary | ICD-10-CM

## 2023-06-26 ENCOUNTER — Other Ambulatory Visit: Payer: Self-pay | Admitting: Neurology

## 2023-06-26 DIAGNOSIS — G20A1 Parkinson's disease without dyskinesia, without mention of fluctuations: Secondary | ICD-10-CM

## 2023-06-29 ENCOUNTER — Ambulatory Visit: Payer: Medicare Other | Admitting: Neurology

## 2023-07-03 NOTE — Progress Notes (Unsigned)
Assessment/Plan:   1.  Parkinson's disease             -Continue carbidopa/levodopa 25/100, 2 tablets at 8:30 AM/12:30 PM/4:30 PM.               -continue carbidopa/levodopa 25/100 CR at bed for nighttime tremor and turning over in the bed.     2.  MCI             -Neurocognitive testing with Dr. Lendell Caprice in 2022 with evidence of MCI only.  ***We sent a referral for repeat testing in April, 2024   3.  Mild anxiety/depression, with increased fears             -Some of the patient's fears are very reasonable (worries about friends with Parkinson's to have worsened or have passed away, and worries about what this means for the future).  Discussed with her that I really think that counseling could be of significant value.  Told her I think that Dr. Lendell Caprice has someone with her who does this.  She is going to ask.             - increase Lexapro 20 mg daily   4..Frequent urinary incontinence with history of multiple UTIs             -Has followed with urology and urogynecology in the past.  Has had multiple interventions, including surgical interventions.  She has had bladder Botox.  She has been on Myrbetriq, which apparently caused hallucinations (very unusual).  Discussed with them that while Parkinson certainly can cause urinary incontinence, this discussion or any further intervention would really need to be done with the urologist.  We did mention gemtysa, but also talked about the fact that it can be very costly.   5.  Nighttime hallucinations             -This occurs about 1 time per week when she wakes up in the middle of the night.  Discussed quetiapine and Nuplazid, but decided to hold off on these since we are increasing the escitalopram.  We can certainly address this in the future.  6.  Syncope  -This was apparently in the setting of acute kidney injury and UTI.  Patient was hospitalized at [facility] for this.  Notes indicate that patient was orthostatic at that  time.  Subjective:   Colleen Stephens was seen today in follow up for Parkinsons disease.  My previous records were reviewed prior to todays visit as well as outside records available to me.  Daughter with patient and supplements the history.   Last visit was a video visit.  We increased the lexapro.  Will she is tolerating that well, without side effects.  They called me in April and asked me for a new referral for cognitive testing with Dr. Lendell Caprice.  We sent that.  I did not get any reports back.  She reports today that ***.  Patient was in the emergency room on May 16 after a fall from a ladder.  Patient was trying to reach for some decorations on her stove and lost her footing on her ladder and fell.  CT brain at [facility] was reported to be negative.  She also did not sustain any rib fractures.  However, she was back at [facility] about 10 days later and admitted for syncope in the setting of dehydration, acute kidney injury, UTI.  Notes indicate that echo was completed and was normal  with a left ventricular ejection fraction of 65% to 70%.  They noted that orthostatic vital signs were positive, but unclear exactly what those were.  Patient did follow-up with primary care after hospitalization.  Primary care notes from June 13th are reviewed.  Patient was complaining of continued dizziness and urinary frequency.  Patient's blood pressure in primary care office was only 98/63 and they noted that patient was orthostatic.  Kidney ultrasound was apparently ordered.  Patient was told to increase fluids.  Patient was in the ER June 26 for chest pain.  Emergency room workup was negative and patient was discharged from the emergency room.  Current prescribed movement disorder medications: carbidopa/levodopa 25/100, 2 tablets at 8:30 AM/12:30 PM/4:30 PM Carbidopa/levodopa 25/100 CR at bedtime  Lexapro, 20 mg daily (increase last visit)  PREVIOUS MEDICATIONS: pramipexole ER, 0.75 mg daily (d/c d/t  hallucination); carbidopa/levodopa 50/200 CR at night (initially thought it caused hallucinations q hs but turned out to be vivid dreaming)    ALLERGIES:   Allergies  Allergen Reactions   Amoxicillin     REACTION: hives   Metoclopramide Hcl     REACTION: tongue swelling    CURRENT MEDICATIONS:  No outpatient medications have been marked as taking for the 07/05/23 encounter (Appointment) with Kieanna Rollo, Octaviano Batty, DO.     Objective:   PHYSICAL EXAMINATION:    VITALS:   There were no vitals filed for this visit.   GEN:  The patient appears stated age and is in NAD. HEENT:  Normocephalic, atraumatic.  The mucous membranes are moist. The superficial temporal arteries are without ropiness or tenderness. CV:  RRR Lungs:  CTAB Neck/HEME:  There are no carotid bruits bilaterally.  Neurological examination:  Orientation: The patient is alert and oriented x3. Cranial nerves: There is good facial symmetry with minimal facial hypomimia. The speech is fluent and clear. Soft palate rises symmetrically and there is no tongue deviation. Hearing is intact to conversational tone. Sensation: Sensation is intact to light touch throughout Motor: Strength is at least antigravity x4.  Movement examination: Tone: there is normal tone in the UE/LE.  This is significant improvement. Abnormal movements: there is no rest tremor today.  There is dyskinesia of the right leg, mild Coordination:  There is very minimal decremation today of rapid alternating movements.  This is a marked improvement from when I saw her in person previously. Gait and Station: The patient easily arises out of the chair and walks well.  Again, this is a marked improvement.  I have reviewed and interpreted the following labs independently    Chemistry      Component Value Date/Time   NA 140 05/12/2013 0806   K 4.4 05/12/2013 0806   CL 106 05/12/2013 0806   CO2 26 05/12/2013 0806   BUN 23 05/12/2013 0806   CREATININE 0.8  05/12/2013 0806      Component Value Date/Time   CALCIUM 9.2 05/12/2013 0806   ALKPHOS 50 05/12/2013 0806   AST 26 05/12/2013 0806   ALT 27 05/12/2013 0806   BILITOT 0.6 05/12/2013 0806       Lab Results  Component Value Date   WBC 7.7 05/12/2013   HGB 13.6 05/12/2013   HCT 40.0 05/12/2013   MCV 88.1 05/12/2013   PLT 204.0 05/12/2013    Lab Results  Component Value Date   TSH 2.31 05/12/2013     Total time spent on today's visit was *** minutes, including both face-to-face time and nonface-to-face time.  Time included that spent on review of records (prior notes available to me/labs/imaging if pertinent), discussing treatment and goals, answering patient's questions and coordinating care.  Cc:  Sheran Fava, FNP

## 2023-07-04 ENCOUNTER — Other Ambulatory Visit: Payer: Self-pay | Admitting: Neurology

## 2023-07-05 ENCOUNTER — Ambulatory Visit (INDEPENDENT_AMBULATORY_CARE_PROVIDER_SITE_OTHER): Payer: Medicare Other | Admitting: Neurology

## 2023-07-05 VITALS — BP 100/62 | HR 74 | Ht 62.0 in | Wt 162.2 lb

## 2023-07-05 DIAGNOSIS — G20B1 Parkinson's disease with dyskinesia, without mention of fluctuations: Secondary | ICD-10-CM | POA: Diagnosis not present

## 2023-07-05 DIAGNOSIS — F330 Major depressive disorder, recurrent, mild: Secondary | ICD-10-CM | POA: Diagnosis not present

## 2023-07-05 DIAGNOSIS — G3184 Mild cognitive impairment, so stated: Secondary | ICD-10-CM

## 2023-07-05 DIAGNOSIS — G903 Multi-system degeneration of the autonomic nervous system: Secondary | ICD-10-CM | POA: Diagnosis not present

## 2023-07-05 MED ORDER — MIDODRINE HCL 5 MG PO TABS: 5.0000 mg | ORAL_TABLET | Freq: Three times a day (TID) | ORAL | 1 refills | Status: DC

## 2023-07-05 NOTE — Patient Instructions (Signed)
We discussed Colleen Stephens.  She is in [facility].  [email or web address]  Start midodrine 5 mg three times per day with meals Ask Dr. Lendell Caprice about counseling resources Talk with your urologist about your bladder

## 2023-07-07 ENCOUNTER — Other Ambulatory Visit: Payer: Self-pay | Admitting: Neurology

## 2023-07-07 DIAGNOSIS — G20A1 Parkinson's disease without dyskinesia, without mention of fluctuations: Secondary | ICD-10-CM

## 2023-08-07 ENCOUNTER — Encounter: Payer: Self-pay | Admitting: Neurology

## 2023-09-03 ENCOUNTER — Other Ambulatory Visit: Payer: Self-pay | Admitting: Neurology

## 2023-09-11 ENCOUNTER — Other Ambulatory Visit: Payer: Self-pay | Admitting: Neurology

## 2023-09-11 DIAGNOSIS — G20A1 Parkinson's disease without dyskinesia, without mention of fluctuations: Secondary | ICD-10-CM

## 2023-09-18 NOTE — Progress Notes (Unsigned)
Virtual Visit Via Video       Consent was obtained for video visit:  Yes.   Answered questions that patient had about telehealth interaction:  Yes.   I discussed the limitations, risks, security and privacy concerns of performing an evaluation and management service by telemedicine. I also discussed with the patient that there may be a patient responsible charge related to this service. The patient expressed understanding and agreed to proceed.  Pt location: Home Physician Location: office Name of referring provider:  Sheran Fava, FNP I connected with Colleen Stephens at patients initiation/request on 09/19/2023 at  2:30 PM EDT by video enabled telemedicine application and verified that I am speaking with the correct person using two identifiers. Pt MRN:  [id number] Pt DOB:  [birth year] Video Participants:  Colleen Stephens;  daughter  Assessment/Plan:   1.  Parkinson's disease with Parkinson's dyskinesia             -Continue carbidopa/levodopa 25/100, 2 tablets at 8:30 AM/12:30 PM/4:30 PM.               -continue carbidopa/levodopa 25/100 CR at bed for nighttime tremor and turning over in the bed.     2.  Memory change             -Neurocognitive testing with Dr. Lendell Caprice in 2022 with evidence of MCI only.   -Repeat neurocognitive testing in September, 2024 with evidence of very mild dementia.  Asked about Exelon.  While Exelon is the only medication studied specifically for Parkinson's, I have generally had much more nausea associated with that medication.  If we are going to use that class of medications, I would prefer to use donepezil.  Her vitals were reviewed over the last few years, and she does not have bradycardia.  They were agreeable and we decided to start donepezil, 5 mg nightly.  Sent to the pharmacy.  We discussed risks, benefits, side effects.   3.  Mild anxiety/depression, with increased fears             -Some of the patient's fears are very reasonable (worries about  friends with Parkinson's to have worsened or have passed away, and worries about what this means for the future).   -she started counseling this week. -Ask about adding/changing anxiety medications.  I told them that they were really going to have to follow-up with primary care about this.  Discussed that I was not going to be able to manage this part of her care             -Continue Lexapro 20 mg daily   4..Frequent urinary incontinence with history of multiple UTIs             -Has followed with urology and urogynecology in the past.  Has had multiple interventions, including surgical interventions.  She has had bladder Botox.  She has been on Myrbetriq, which apparently caused hallucinations (very unusual).  Discussed with them that while Parkinson certainly can cause urinary incontinence, this discussion or any further intervention would really need to be done with the urologist.  We discussed this last visit and today as well. 5.  Nighttime hallucinations             -This occurs about 1 time per week when she wakes up in the middle of the night (similar to last visit).  Discussed quetiapine and Nuplazid, but decided to hold off on these since we are increasing the escitalopram.  We  can certainly address this in the future.  6.  Syncope with significant Neurogenic Orthostatic Hypotension in the office today  -syncope was apparently in the setting of acute kidney injury and UTI.  Patient was hospitalized at [facility] for this.  Notes indicate that patient was orthostatic at that time and has been significantly orthostatic in other offices as well before coming here today  -Continue midodrine 5 mg tid with meals.  We just started it and initially they thought it was very helpful and that she had no falls, but she recently sustained a fall that they thought may have been due to low blood pressure.  I asked them to keep track of blood pressures in various positions and get back to me.    -Needs to  increase hydration.  Subjective:   Colleen Stephens was seen today in follow up for Parkinsons disease.  My previous records were reviewed prior to todays visit as well as outside records available to me.  Daughter with patient and supplements the history.  I worked the patient pt in  at daughter's request.  Patient is with her daughter today.  They met with Dr. Lendell Caprice not long after our last visit.  I reviewed her notes.  She stated that "current test performance reveals ongoing, and stable, cognitive impairment and some executive dysfunctions and visual-spatial function, and mild worsening in response inhibition and no significant worsening and complex planning and auditory comprehension."  Because of that, she felt that patient did meet criteria for Parkinson's dementia, although in the very mild range.  Patient's daughter wanted to start Exelon.  I wanted to talk about that with them, given potential side effects.  They present today to discuss that further.  Separately, I did start the patient on midodrine last visit and they report today that it was helping but recently seems to have gotten low again and she had a fall this week.  They are going to pcp but state that they have been told to increase hydration.  They haven't been checking the BP regularly.  They ask about adding/changing anxiety medications.  She just started counseling this week.  Current prescribed movement disorder medications: carbidopa/levodopa 25/100, 2 tablets at 8:30 AM/12:30 PM/4:30 PM Carbidopa/levodopa 25/100 CR at bedtime  Lexapro, 20 mg daily  Midodrine, 5 mg 3 times per day with meals (started last visit)  PREVIOUS MEDICATIONS: pramipexole ER, 0.75 mg daily (d/c d/t hallucination); carbidopa/levodopa 50/200 CR at night (initially thought it caused hallucinations q hs but turned out to be vivid dreaming)    ALLERGIES:   Allergies  Allergen Reactions   Amoxicillin     REACTION: hives   Metoclopramide Hcl      REACTION: tongue swelling    CURRENT MEDICATIONS:  No outpatient medications have been marked as taking for the 09/19/23 encounter (Appointment) with Rhema Boyett, Octaviano Batty, DO.     Objective:   PHYSICAL EXAMINATION:    VITALS:   There were no vitals filed for this visit.   No data found.   GEN:  The patient appears stated age and is in NAD.   Neurological examination:  Orientation: The patient is alert and oriented x3. Cranial nerves: There is good facial symmetry with minimal facial hypomimia. The speech is fluent and clear. Soft palate rises symmetrically and there is no tongue deviation. Hearing is intact to conversational tone.  Movement examination: Abnormal movements: Some dyskinesias noted over the video today.   I have reviewed and interpreted the following  labs independently    Chemistry      Component Value Date/Time   NA 140 05/12/2013 0806   K 4.4 05/12/2013 0806   CL 106 05/12/2013 0806   CO2 26 05/12/2013 0806   BUN 23 05/12/2013 0806   CREATININE 0.8 05/12/2013 0806      Component Value Date/Time   CALCIUM 9.2 05/12/2013 0806   ALKPHOS 50 05/12/2013 0806   AST 26 05/12/2013 0806   ALT 27 05/12/2013 0806   BILITOT 0.6 05/12/2013 0806       Lab Results  Component Value Date   WBC 7.7 05/12/2013   HGB 13.6 05/12/2013   HCT 40.0 05/12/2013   MCV 88.1 05/12/2013   PLT 204.0 05/12/2013    Lab Results  Component Value Date   TSH 2.31 05/12/2013    Follow up Instructions      -I discussed the assessment and treatment plan with the patient. The patient was provided an opportunity to ask questions and all were answered. The patient agreed with the plan and demonstrated an understanding of the instructions.   The patient was advised to call back or seek an in-person evaluation if the symptoms worsen or if the condition fails to improve as anticipated.    Total time spent on today's visit was 30 minutes, including both face-to-face time and  nonface-to-face time.  Time included that spent on review of records (prior notes available to me/labs/imaging if pertinent), discussing treatment and goals, answering patient's questions and coordinating care.   Kerin Salen, DO   Cc:  Carlean Jews, FNP

## 2023-09-19 ENCOUNTER — Telehealth (INDEPENDENT_AMBULATORY_CARE_PROVIDER_SITE_OTHER): Payer: Medicare Other | Admitting: Neurology

## 2023-09-19 ENCOUNTER — Encounter: Payer: Self-pay | Admitting: Neurology

## 2023-09-19 DIAGNOSIS — G20B1 Parkinson's disease with dyskinesia, without mention of fluctuations: Secondary | ICD-10-CM | POA: Diagnosis not present

## 2023-09-19 DIAGNOSIS — G903 Multi-system degeneration of the autonomic nervous system: Secondary | ICD-10-CM | POA: Diagnosis not present

## 2023-09-19 DIAGNOSIS — F0280 Dementia in other diseases classified elsewhere without behavioral disturbance: Secondary | ICD-10-CM

## 2023-09-19 DIAGNOSIS — G20A1 Parkinson's disease without dyskinesia, without mention of fluctuations: Secondary | ICD-10-CM

## 2023-09-19 MED ORDER — DONEPEZIL HCL 5 MG PO TABS: 5.0000 mg | ORAL_TABLET | Freq: Every day | ORAL | 1 refills | Status: DC

## 2023-09-22 ENCOUNTER — Other Ambulatory Visit: Payer: Self-pay | Admitting: Neurology

## 2023-09-22 DIAGNOSIS — G20A1 Parkinson's disease without dyskinesia, without mention of fluctuations: Secondary | ICD-10-CM

## 2023-11-28 ENCOUNTER — Other Ambulatory Visit: Payer: Self-pay | Admitting: Neurology

## 2023-11-28 DIAGNOSIS — F330 Major depressive disorder, recurrent, mild: Secondary | ICD-10-CM

## 2023-12-08 ENCOUNTER — Other Ambulatory Visit: Payer: Self-pay | Admitting: Neurology

## 2023-12-08 DIAGNOSIS — G20A1 Parkinson's disease without dyskinesia, without mention of fluctuations: Secondary | ICD-10-CM

## 2023-12-13 ENCOUNTER — Other Ambulatory Visit: Payer: Self-pay | Admitting: Neurology

## 2023-12-13 DIAGNOSIS — G903 Multi-system degeneration of the autonomic nervous system: Secondary | ICD-10-CM

## 2023-12-13 DIAGNOSIS — G20B1 Parkinson's disease with dyskinesia, without mention of fluctuations: Secondary | ICD-10-CM

## 2024-01-15 ENCOUNTER — Telehealth: Payer: Medicare Other | Admitting: Neurology

## 2024-02-13 ENCOUNTER — Other Ambulatory Visit: Payer: Self-pay | Admitting: Neurology

## 2024-02-13 DIAGNOSIS — F330 Major depressive disorder, recurrent, mild: Secondary | ICD-10-CM

## 2024-02-19 ENCOUNTER — Other Ambulatory Visit: Payer: Self-pay | Admitting: Neurology

## 2024-02-19 DIAGNOSIS — G20B1 Parkinson's disease with dyskinesia, without mention of fluctuations: Secondary | ICD-10-CM

## 2024-02-23 ENCOUNTER — Other Ambulatory Visit: Payer: Self-pay | Admitting: Neurology

## 2024-02-23 DIAGNOSIS — G20A1 Parkinson's disease without dyskinesia, without mention of fluctuations: Secondary | ICD-10-CM

## 2024-03-11 ENCOUNTER — Other Ambulatory Visit: Payer: Self-pay | Admitting: Neurology

## 2024-03-11 DIAGNOSIS — G20A1 Parkinson's disease without dyskinesia, without mention of fluctuations: Secondary | ICD-10-CM

## 2024-03-11 NOTE — Progress Notes (Unsigned)
 Virtual Visit Via Video       Consent was obtained for video visit:  Yes.   Answered questions that patient had about telehealth interaction:  Yes.   I discussed the limitations, risks, security and privacy concerns of performing an evaluation and management service by telemedicine. I also discussed with the patient that there may be a patient responsible charge related to this service. The patient expressed understanding and agreed to proceed.  Pt location: Home Physician Location: office Name of referring provider:  Carlean Jews, FNP I connected with Colleen Stephens at patients initiation/request on 03/14/2024 at  8:45 AM EDT by video enabled telemedicine application and verified that I am speaking with the correct person using two identifiers. Pt MRN:  [id number] Pt DOB:  1946-02-02 Video Participants:  [name];  daughter  Assessment/Plan:   1.  Parkinson's disease with Parkinson's dyskinesia             -Continue carbidopa/levodopa 25/100, 2 tablets at 8:30 AM/12:30 PM/4:30 PM.               -continue carbidopa/levodopa 25/100 CR at bed for nighttime tremor and turning over in the bed.     2.  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 Lendell Caprice in 2022 with evidence of MCI only.   -on donepezil, 5 mg daily -Reassured her that the "hallucinations" that she was having was really just a fairly normal phenomenon that we see in Parkinson's disease.  She was awakening from a dream and would initially still be in dream state and see a figure, but once awake, that figure would go away quickly.  She has no hallucinations in the daytime.   3.  Mild anxiety/depression, with increased fears             -Some of the patient's fears are very reasonable (worries about friends with Parkinson's to have worsened or have passed away, and worries about what this means for the future).   -she is doing some counseling now. -She has moved to independent living, but is  able to eat dinner in a communal setting and that has helped.             -Continue Lexapro 20 mg daily   4..Frequent urinary incontinence with history of multiple UTIs             -Has followed with urology and urogynecology in the past.  Has had multiple interventions, including surgical interventions.  She has had bladder Botox.  She has been on Myrbetriq, which apparently caused hallucinations (very unusual).  Discussed with them that while Parkinson certainly can cause urinary incontinence, this discussion or any further intervention would really need to be done with the urologist.  We discussed PTNS today as well as the above interventions.  She admits that she just has not been back for a while regarding bladder Botox and I really think she has to go back to the urologist. 5.  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  Patient was hospitalized at [facility] for this.  Notes indicate that patient was orthostatic at that time and has been significantly orthostatic in other offices as well  -Increase midodrine 10 mg/10mg /5mg  with meals.  They are to watch her blood pressure at home, especially since I did not see her in  the office today. 6.  Hyperlipidemia  -Primary care has prescribed Lipitor.  They have not yet started it.  It looks like her LDL was high.  Patient's daughter asked me if I agreed with that and I did.  7.   Discussed with patient and daughter that I have no objection to video visits, so long as every other visit is in person.  The last 2 visits have been video, so I really need to see her in person next visit, as it would have been 1 year since I have seen her in person.  Subjective:   Colleen Stephens was seen today in follow up for Parkinsons disease.  My previous records were reviewed prior to todays visit as well as outside records available to me.  I have reviewed records  from her nurse practitioner.  Daughter with patient and supplements the history.  Patient with daughter today.  They changed to video visit a few days ago.  Dr. Lendell Caprice sent records from March 3 visit, which was a telepsychology session.  Patient has moved to [place] to independent living.  Patient was started on donepezil last visit.  She is tolerating the medication well, without side effects.  She notes that she sees people at night that are not there but they are always arising from sleep.  It never happens in day.  It awakens her.  The image disappears once she is up to go to the restroom.   Notes problems with some falls - one last week.  Was in garage and was bending over to pick something up and thinks that she passed out and fell on R shoulder and R hip and R knee.  She didn't go to the ER because didn't want to wait there.  She did f/u with her pcp the following Monday.  Her BP's are generally running a bit low (low 100's at doctors office) but they don't keep track at home.  Has lightheadedness.  Her pcp just started lipitor and they ask about starting that and they wonder if she should start it.  Her LDL was 114.    Current prescribed movement disorder medications: carbidopa/levodopa 25/100, 2 tablets at 8:30 AM/12:30 PM/4:30 PM Carbidopa/levodopa 25/100 CR at bedtime  Lexapro, 20 mg daily  Midodrine, 5 mg 3 times per day with meals  Donepezil, 5 mg daily (started last visit)  PREVIOUS MEDICATIONS: pramipexole ER, 0.75 mg daily (d/c d/t hallucination); carbidopa/levodopa 50/200 CR at night (initially thought it caused hallucinations q hs but turned out to be vivid dreaming)    ALLERGIES:   Allergies  Allergen Reactions   Amoxicillin     REACTION: hives   Metoclopramide Hcl     REACTION: tongue swelling    CURRENT MEDICATIONS:  Current Meds  Medication Sig   midodrine (PROAMATINE) 10 MG tablet Take 1 tablet (10 mg total) by mouth 3 (three) times daily. 1 with breakfast and  lunch     Objective:   PHYSICAL EXAMINATION:    VITALS:   There were no vitals filed for this visit.   No data found.   GEN:  The patient appears stated age and is in NAD.   Neurological examination:  Orientation: The patient is alert and oriented x3. Cranial nerves: There is good facial symmetry with minimal facial hypomimia. The speech is fluent and clear. Soft palate rises symmetrically and there is no tongue deviation. Hearing is intact to conversational tone.  Movement examination: Abnormal movements: Some dyskinesias noted over the  video today, primarily in the head. Coordination:  no decremation in the hands.   I have reviewed and interpreted the following labs independently    Chemistry      Component Value Date/Time   NA 140 05/12/2013 0806   K 4.4 05/12/2013 0806   CL 106 05/12/2013 0806   CO2 26 05/12/2013 0806   BUN 23 05/12/2013 0806   CREATININE 0.8 05/12/2013 0806      Component Value Date/Time   CALCIUM 9.2 05/12/2013 0806   ALKPHOS 50 05/12/2013 0806   AST 26 05/12/2013 0806   ALT 27 05/12/2013 0806   BILITOT 0.6 05/12/2013 0806       Lab Results  Component Value Date   WBC 7.7 05/12/2013   HGB 13.6 05/12/2013   HCT 40.0 05/12/2013   MCV 88.1 05/12/2013   PLT 204.0 05/12/2013    Lab Results  Component Value Date   TSH 2.31 05/12/2013    Follow up Instructions      -I discussed the assessment and treatment plan with the patient. The patient was provided an opportunity to ask questions and all were answered. The patient agreed with the plan and demonstrated an understanding of the instructions.   The patient was advised to call back or seek an in-person evaluation if the symptoms worsen or if the condition fails to improve as anticipated.    Total time spent on today's visit was 40 minutes, including both face-to-face time and nonface-to-face time.  Time included that spent on review of records (prior notes available to  me/labs/imaging if pertinent), discussing treatment and goals, answering patient's questions and coordinating care.   Kerin Salen, DO   Cc:  Carlean Jews, FNP

## 2024-03-14 ENCOUNTER — Telehealth: Admitting: Neurology

## 2024-03-14 DIAGNOSIS — F849 Pervasive developmental disorder, unspecified: Secondary | ICD-10-CM | POA: Diagnosis not present

## 2024-03-14 DIAGNOSIS — G20B1 Parkinson's disease with dyskinesia, without mention of fluctuations: Secondary | ICD-10-CM

## 2024-03-14 DIAGNOSIS — F419 Anxiety disorder, unspecified: Secondary | ICD-10-CM

## 2024-03-14 DIAGNOSIS — F32A Depression, unspecified: Secondary | ICD-10-CM

## 2024-03-14 DIAGNOSIS — G20A1 Parkinson's disease without dyskinesia, without mention of fluctuations: Secondary | ICD-10-CM

## 2024-03-14 DIAGNOSIS — F330 Major depressive disorder, recurrent, mild: Secondary | ICD-10-CM

## 2024-03-14 DIAGNOSIS — G903 Multi-system degeneration of the autonomic nervous system: Secondary | ICD-10-CM

## 2024-03-14 MED ORDER — DONEPEZIL HCL 5 MG PO TABS: 5.0000 mg | ORAL_TABLET | Freq: Every day | ORAL | 1 refills | Status: DC

## 2024-03-14 MED ORDER — CARBIDOPA-LEVODOPA 25-100 MG PO TABS: ORAL_TABLET | ORAL | 1 refills | Status: DC

## 2024-03-14 MED ORDER — MIDODRINE HCL 5 MG PO TABS
ORAL_TABLET | ORAL | 1 refills | Status: DC
Start: 2024-03-14 — End: 2024-07-01

## 2024-03-14 MED ORDER — MIDODRINE HCL 10 MG PO TABS: 10.0000 mg | ORAL_TABLET | Freq: Three times a day (TID) | ORAL | 1 refills | Status: AC

## 2024-03-14 MED ORDER — ESCITALOPRAM OXALATE 20 MG PO TABS: 20.0000 mg | ORAL_TABLET | Freq: Every day | ORAL | 1 refills | Status: DC

## 2024-04-03 ENCOUNTER — Encounter: Payer: Self-pay | Admitting: Neurology

## 2024-04-16 NOTE — Telephone Encounter (Signed)
 Called and spoke to patients wife regarding the B/P the numbers they received at PT the other day were  at arrival 115/65  standing for 1 min 77/55 Laying 145/81 Sitting 101/65 Standing again 101/58 Patient started midodrine  on the 20th and has only been drinking 1 bottle of water per day. The hospital also told the daughter that the patient was very dehydrated . Spoke at length about hydrating the patient and taking medication . I told daughter to give meds more time and hydrated the patient and I will check back on Monday to see how she is doing

## 2024-05-06 ENCOUNTER — Other Ambulatory Visit: Payer: Self-pay | Admitting: Neurology

## 2024-05-06 DIAGNOSIS — G20A1 Parkinson's disease without dyskinesia, without mention of fluctuations: Secondary | ICD-10-CM

## 2024-06-23 ENCOUNTER — Encounter: Payer: Self-pay | Admitting: Neurology

## 2024-06-24 ENCOUNTER — Telehealth: Payer: Self-pay | Admitting: Neurology

## 2024-06-24 NOTE — Telephone Encounter (Signed)
 Pt daughter wants Dr.Tat to know her mother is back in the hospital at [facility]. She has broken her nose. She is unsure if she will be out by next week, but will keep us  updated.

## 2024-06-25 NOTE — Progress Notes (Signed)
 Assessment/Plan:   1.  Parkinson's disease with Parkinson's dyskinesia             -Continue carbidopa /levodopa  25/100 but move the timing back to what we had prescribed which was 2 tablets at 8:00 AM/12:00 PM/4:00 PM.               -for now will hold carbidopa /levodopa  25/100 CR at bed for nighttime tremor and turning over in the bed.  This was stopped at her last hospitalization.  They had reported that they stopped it because of orthostasis in the daytime.  It does not make a whole lot of sense to stop and nighttime levodopa  for daytime orthostasis, but I did not restart it because I was making other changes today and she was just recently out of the hospital.   2.  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   -on donepezil , 5 mg daily -Reassured her that the hallucinations that she was having was really just a fairly normal phenomenon that we see in Parkinson's disease.  She was awakening from a dream and would initially still be in dream state and see a figure, but once awake, that figure would go away quickly.  She has no hallucinations in the daytime.   3.  Mild anxiety/depression, with increased fears             -Some of the patient's fears are very reasonable (worries about friends with Parkinson's to have worsened or have passed away, and worries about what this means for the future).   -she is doing some counseling now. -She has moved to independent living, but has 24 hour caregivers.  They ask about how long to keep these.  We gave a goal of 4 weeks to monitor her blood pressure and hopefully get it under good control and get to appointments and see how she does.  I think we will hopefully be able to see that she does better, and get her to wear her compression binder in that time as well.             -Continue Lexapro  20 mg daily.  Apparently, when in the hospital they told her that this medication should be  tapered for orthostasis.  I do not see the reasoning in this, and also she has had significant anxiety/depression/fears.   4..Frequent urinary incontinence with history of multiple UTIs             -Has followed with urology and urogynecology in the past.  Has had multiple interventions, including surgical interventions.  She has had bladder Botox.  She has been on Myrbetriq, which apparently caused hallucinations (very unusual).  Discussed with them that while Parkinson certainly can cause urinary incontinence, this discussion or any further intervention would really need to be done with the urologist.  We discussed PTNS today as well as the above interventions, which have also been discussed at prior visits.  She needs urology f/u 5.  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  She was then put in the hospital in June, 2025 for other reasons and it is unclear to me why her midodrine  was decreased, but not long after discharge, she presented with another syncopal episode.             -  continue midodrine  10 mg tid.  Move to tid with meals (away from bed). -on florinef now by hospital 0.1 mg daily.  Her daughter asked about me prescribing this, and I told her that I suspect that this will be cardiology who takes this medication over. -   Cardiology is now seeing the patient and has appt on 7/29 [name] at [facility] cardiology  - Discussed that she needed to start wearing her compression binder.  She told me that it bothers her underneath her breasts, because she generally does not wear a bra.  We discussed that she really needs to find some type of comfortable bra so that she can wear the compression binder.  We talked about medical stores that supply bras (especially those that support breast cancer patients) that could help her find appropriate bras that allow her to also wear her compression binder 6.    Expressive aphasia  - Patient admitted [facility] June 02, 2024 after having difficulty speaking while at rock steady boxing.  Emergency room workup was unremarkable.  Teleneurology was consulted and TNK was recommended and given.  - CT brain and MRI brain were normal per notes (no films available to me)  - Patient was to be on aspirin and Plavix for 21 days from June 06, 2024 and then aspirin alone.  She is to stop the Plavix, as she is now over the 21-day mark.  - Patient on Lipitor, 40 mg daily.  Subjective:   Colleen Stephens was seen today in follow up for Parkinsons disease.  My previous records were reviewed prior to todays visit as well as outside records available to me.  Daughter with patient and supplements the history.   I last saw her for an in person visit about a year ago.  She has had a few video visit since that time.  Much has happened since our last visit.  Notes are reviewed.  When I last saw the patient, I did increase her midodrine  to 10 mg/10 mg / 5 mg.  She was since fairly recently in the hospital at [facility].  She was admitted on June 02, 2024.  Patient was at rock steady boxing and began having difficulty speaking.  She was taken to the emergency room.  Emergency room notes noted that the patient was answering questions and joking and following commands.  Emergency room workup was negative.  Teleneurology was consulted and they recommended TNK, which was given.  MRI brain was eventually completed and also was negative.  She was discharged to inpatient rehab with a goal of being on aspirin and Plavix for 21 days and then aspirin alone.  She was discharged on Lipitor, 40 mg daily.  When she went to rehab she was on the midodrine  dosage that we had given her (10 mg/10 mg / 5 mg).  It is unclear in the notes why that was changed, but discharge instructions indicate that she was discharged from rehab on a lower dose of midodrine , 10 mg twice per  day.  Not long thereafter, on June 23, 2024, the patient presented again to [facility] after a syncopal episode (the emergency room records at [facility] called it a syncopal fall) and the patient suffered a nondisplaced nasal bone fracture.  Records indicate that patient found to be very orthostatic despite already being on midodrine .  They also note that cardiology was consulted.  Patient and daughter report  that she is now on midodrine  10 mg tid.  She is given those at 8am/noon/8pm.  She is also now on florinef 0.1 daily at 8am.  They were also told to stop the nighttime levodopa  and spread out the daytime levodopa  to avoid orthostasis.  She has an abdominal compression binder but does not wear it.  Pt asks about urinary frequency   Current prescribed movement disorder medications: carbidopa /levodopa  25/100, 2 tablets at 8:30 AM/12:30 PM/4:30 PM Carbidopa /levodopa  25/100 CR at bedtime  Lexapro , 20 mg daily  Midodrine , 10 mg twice per day (changed during her hospitalization in June, 2025 at [facility])  PREVIOUS MEDICATIONS: pramipexole ER, 0.75 mg daily (d/c d/t hallucination); carbidopa /levodopa  50/200 CR at night (initially thought it caused hallucinations q hs but turned out to be vivid dreaming)    ALLERGIES:   Allergies  Allergen Reactions   Amoxicillin     REACTION: hives   Metoclopramide Hcl     REACTION: tongue swelling    CURRENT MEDICATIONS:  Current Meds  Medication Sig   alendronate (FOSAMAX) 10 MG tablet Take 10 mg by mouth daily.   aspirin EC 81 MG tablet Take 81 mg by mouth daily.   carbidopa -levodopa  (SINEMET  IR) 25-100 MG tablet TAKE 2 TABLETS BY MOUTH AT 8:30  AM, 12:30 PM, 4:30 PM (Patient taking differently: Take 2 tablets by mouth 3 (three) times daily. TAKE 2 TABLETS BY MOUTH AT 8:00AM  2 @ 12  228)   Cholecalciferol 125 MCG (5000 UT) TABS Take 125 tablets by mouth daily.   clopidogrel (PLAVIX) 75 MG tablet Take  75 mg by mouth once.   donepezil  (ARICEPT ) 5 MG tablet Take 1 tablet (5 mg total) by mouth at bedtime.   escitalopram  (LEXAPRO ) 20 MG tablet Take 1 tablet (20 mg total) by mouth daily.   esomeprazole (NEXIUM) 40 MG capsule TAKE 1 CAPSULE(40 MG) BY MOUTH DAILY   Estrogens, Conjugated (PREMARIN VA) Place 0.5 g vaginally 2 (two) times a week.   fludrocortisone (FLORINEF) 0.1 MG tablet Take 0.1 mg by mouth daily.   midodrine  (PROAMATINE ) 10 MG tablet Take 1 tablet (10 mg total) by mouth 3 (three) times daily. 1 with breakfast and lunch (Patient taking differently: Take 10 mg by mouth 3 (three) times daily. 1 with breakfast and lunch and at bed)     Objective:   PHYSICAL EXAMINATION:    VITALS:   Vitals:   07/01/24 1117  BP: 126/68  Pulse: 66  SpO2: 97%     Orthostatic VS for the past 72 hrs (Last 3 readings):  Orthostatic BP Patient Position BP Location Orthostatic Pulse  07/01/24 1118 100/60 Standing Left Arm --  07/01/24 1117 -- Sitting Left Arm --  07/01/24 1116 140/70 Supine Left Arm 95    GEN:  The patient appears stated age and is in NAD. HEENT:  Normocephalic.  There is healing ecchymosis around the eyes (yellow).    The mucous membranes are moist. The superficial temporal arteries are without ropiness or tenderness. CV:  RRR Lungs:  CTAB Neck/HEME:  There are no carotid bruits bilaterally.  Neurological examination:  Orientation: The patient is alert and oriented x3. Cranial nerves: There is good facial symmetry with minimal facial hypomimia. The speech is fluent and clear. Soft palate rises symmetrically and there is no tongue deviation. Hearing is intact to conversational tone. Sensation: Sensation is intact to light touch throughout Motor: Strength is at least antigravity x4.  Movement examination: Tone: there is normal  tone in the UE/LE.   Abnormal movements: there is no rest tremor today.  There is dyskinesia of the L side, mild Coordination:  There is mild  decremation on the R Gait and Station: The patient pushes off to rise and we had her stand for about 30 seconds before using the rollator to walk in the hall.  She walks fairly well with the rollator.  I have reviewed and interpreted the following labs independently    Chemistry      Component Value Date/Time   NA 140 05/12/2013 0806   K 4.4 05/12/2013 0806   CL 106 05/12/2013 0806   CO2 26 05/12/2013 0806   BUN 23 05/12/2013 0806   CREATININE 0.8 05/12/2013 0806      Component Value Date/Time   CALCIUM 9.2 05/12/2013 0806   ALKPHOS 50 05/12/2013 0806   AST 26 05/12/2013 0806   ALT 27 05/12/2013 0806   BILITOT 0.6 05/12/2013 0806       Lab Results  Component Value Date   WBC 7.7 05/12/2013   HGB 13.6 05/12/2013   HCT 40.0 05/12/2013   MCV 88.1 05/12/2013   PLT 204.0 05/12/2013    Lab Results  Component Value Date   TSH 2.31 05/12/2013     Total time spent on today's visit was 66 minutes, including both face-to-face time and nonface-to-face time.  Time included that spent on review of records (prior notes available to me/labs/imaging if pertinent), discussing treatment and goals, answering patient's questions and coordinating care.  Cc:  Maureen Nunzio Schanz, FNP

## 2024-07-01 ENCOUNTER — Encounter: Payer: Self-pay | Admitting: Neurology

## 2024-07-01 ENCOUNTER — Ambulatory Visit

## 2024-07-01 ENCOUNTER — Ambulatory Visit: Admitting: Neurology

## 2024-07-01 VITALS — BP 126/68 | HR 66

## 2024-07-01 DIAGNOSIS — R55 Syncope and collapse: Secondary | ICD-10-CM | POA: Diagnosis not present

## 2024-07-01 DIAGNOSIS — R4701 Aphasia: Secondary | ICD-10-CM

## 2024-07-01 DIAGNOSIS — R32 Unspecified urinary incontinence: Secondary | ICD-10-CM

## 2024-07-01 DIAGNOSIS — G903 Multi-system degeneration of the autonomic nervous system: Secondary | ICD-10-CM

## 2024-07-01 DIAGNOSIS — G459 Transient cerebral ischemic attack, unspecified: Secondary | ICD-10-CM

## 2024-07-01 DIAGNOSIS — F418 Other specified anxiety disorders: Secondary | ICD-10-CM | POA: Diagnosis not present

## 2024-07-01 DIAGNOSIS — F330 Major depressive disorder, recurrent, mild: Secondary | ICD-10-CM

## 2024-07-01 DIAGNOSIS — G20B1 Parkinson's disease with dyskinesia, without mention of fluctuations: Secondary | ICD-10-CM | POA: Diagnosis not present

## 2024-07-01 NOTE — Patient Instructions (Addendum)
 Take midodrine  10 mg three times per day with meals Hold the CR levodopa  at bed for now Use the abdominal compression binder at least while up and awake (especially in the morning) STOP Plavix  SAVE THE DATE!  We are planning a Parkinsons Disease educational symposium at [facility], [address] [place], [place], [place] [address] on September 19.  We will have a movement disorder physician expert from [employer] coming to speak and a caregiver speaker.  We will have a panel of experts that will show you who you may need on your team of people on your journey with Parkinsons.  I hope to see you there!  Use this QR code to register by scanning it with the camera app on your phone:      Need more help with registration?  Contact [email or web address]

## 2024-07-15 ENCOUNTER — Telehealth: Payer: Self-pay | Admitting: Neurology

## 2024-07-15 NOTE — Telephone Encounter (Signed)
 1. Which medications need refilled? (List name and dosage, if known) midodrine  dosage changed so they need updated info by phone so they can rush it for today so she doesn't run out.   2. Which pharmacy/location is medication to be sent to? (include street and city if local pharmacy) [facility]   If it can't be called by phone today then let the daughter know so she can figure something out at a local pharmacy

## 2024-07-15 NOTE — Telephone Encounter (Signed)
 Called prescription in to [facility] and called Tobias patients daughter to let her know

## 2024-07-31 ENCOUNTER — Other Ambulatory Visit: Payer: Self-pay | Admitting: Neurology

## 2024-07-31 DIAGNOSIS — G20A1 Parkinson's disease without dyskinesia, without mention of fluctuations: Secondary | ICD-10-CM

## 2024-08-12 ENCOUNTER — Encounter: Payer: Self-pay | Admitting: Neurology

## 2024-09-01 ENCOUNTER — Telehealth: Payer: Self-pay | Admitting: Neurology

## 2024-09-01 NOTE — Telephone Encounter (Signed)
 Pt. Colleen Stephens and daughter has a few questions for next steps

## 2024-09-02 NOTE — Discharge Summary (Addendum)
 Admitting Provider: Gilmore Hector, MD Discharge Provider: Ofilia Kanaris, MD  Admission Date: 08/29/2024      Admission Diagnosis:  Thyroid nodule [E04.1] Left leg weakness [R29.898] Left arm weakness [R29.898] Stroke-like symptoms [R29.90] Injury of head, initial encounter [S09.90XA] Fall, initial encounter [W19.XXXA] Discharge Date: 09/02/2024  Problem List: Principal Problem  Syncope likely orthostatic /vasovagal   Active Problems Parkinson's disease  GERD continue pantoprazole Anxiety Depression  Hyperlipidemia Dementia    Discharge Diagnosis Syncope likely orthostatic /vasovagal    Home Health needed - N/A  Disposition - [facility] IPR  Patient Condition: stable   Hospital Course:  Hospital course  78 y.o. female presenting with syncopal episode while at the gym.  Patient has known history of Parkinson's disease and was hit in the boxing bag and suddenly became distended black patient reports that prior to this she was very diaphoretic sweaty and hot.  Patient apparently fell backwards hitting the head and also the right side of the body and hip patient was brought to the ER for further evaluation in the ER evaluation revealed evidence of left-sided weakness both upper and lower extremities there was painful restricted range of motion of the right hip and lower extremities due to pain because of the left-sided weakness stroke protocol was initiated.  CT head and also CT angio of the head and neck was all negative.  Patient was evaluated by teleneurology recommendation the patient be admitted for stroke workup.  Patient is awake and alert following commands and moving all extremities.  Patient also noted to have elevated blood pressures though patient has history of orthostatic hypotension on midodrine  and fludrocortisone at home.Patient should IV fluids, blood pressures have been normal/elevated at baseline on laying down.  However still significant orthostatic vitals despite  IV fluids.  Neurology and cardiology were consulted, transthoracic echo done, no acute findings.  MRI done, no stroke noted. Her symptoms are likely from orthostatic vitals from her Parkinson's disease from autonomic dysfunction.  She has been started on compression stocking and abdominal binder when she is up. Pt seen by PT and OT and recommended IPR and been accepted by PM& R   Assessment/Plan: Principal Problem  Syncope likley orthostatic/vasovagal Pt with prior history of Orthostatic Hypotension  Acute stroke has been ruled out Likely orthostatic hypotension, autonomic dysfunction in setting of Parkinson's disease Trauma workup in the ED was negative which included CT scan head, CT angio head and neck, x-ray hip, CT abdomen and pelvis MRI brain, no acute infarct, chronic small vessel ischemic changes Continue Aspirin, Plavix, statin Neurology consulted, follow recommendation, likely vasovagal, orthostatic syncopal episode Cardiology consulted, recommended likely vasovagal or orthostatic and recommended to f.up with her out pt cardiology for out pt event monitor and no further w.up recommended  Given her blood pressure previous provider did not resume her home dose of midodrine  or Florinef.  Discussed with the daughter at length and her outpatient providers recommend midodrine  10 mg 3 times daily and Florinef that will help with her orthostatic symptoms to help work with physical therapy.  Medications updated. TED hose and abdominal binder when patient is up Fall precautions PMR consulted, accepted to IPR   Active Problems Parkinson's disease  Continue Parkinson's home meds, Sinemet  3 times a day along with long-acting Sinemet  in the evening   GERD continue pantoprazole   Anxiety, depression continue Lexapro    Hyperlipidemia - continue statin   Dementia - continue Donepezil  , supportive care     Pertinent Test Results:  Results from  last 7 days  Lab Units 09/01/24 0510  08/30/24 0705 08/29/24 1500  WBC x10*3/uL 6.2 6.0 7.6  HEMOGLOBIN g/dL 88.2* 88.5* 89.3*  HEMATOCRIT % 35.6* 35.2* 30.7*  PLATELETS AUTO x10*3/uL 227 194 211   Results from last 7 days  Lab Units 09/01/24 0510 08/30/24 0705 08/29/24 1500  SODIUM mmol/L 141 141 138  POTASSIUM mmol/L 3.8 3.6 4.2  CHLORIDE mmol/L 102 103 101  CARBON DIOXIDE mmol/L 30 27 23   BUN mg/dL 17 21 26*  CREATININE mg/dL 9.11 9.15 8.80  CALCIUM mg/dL 9.7 8.9 9.0  PROTEIN TOTAL g/dL  --   --  6.7  BILIRUBIN TOTAL mg/dL  --   --  0.6  ALT U/L  --   --  9  AST U/L  --   --  24  GLUCOSE mg/dL 95 87 95      No results found for: TROPONINT Results from last 7 days  Lab Units 08/30/24 0705 08/29/24 1500  APTT Seconds  --  22.6*  INR  1.0 1.0   X-ray Hip 2+ Views Right       INDICATION: Right hip pain  COMPARISON: None.  TECHNIQUE: AP view of the pelvis and frog-leg lateral view of the right hip.  FINDINGS: No acute fracture or subluxation.  Soft tissues are normal.  IMPRESSION: No acute fracture or subluxation.  Thank you very much for this referral.  Electronically signed by: Charlie Collar, MD 08/29/2024 3:59 PM      VITAL SIGN: Temp:  [36.7 C (98 F)-36.9 C (98.5 F)] 36.7 C (98.1 F) Heart Rate:  [61-69] 69 Resp:  [16-18] 17 BP: (136-171)/(81-89) 154/84  Pertinent Physical Exam At Time of Discharge: General-patient is in no acute distress Lungs-no increased work of breathing, clear to auscultation Cardiovascular-regular rate and rhythm, S1 and S2 Abdomen-soft, bowel sounds present Extremities-no clubbing or cyanosis Neuro- awake and alert, at her baseline    Discharge Follow-Up:  1. See DC Med Rec for medication changes , if any, and for complete list of medications on discharge. 2. Follow up with PCP x 1 week.  Future Appointments       Provider Department Center   11/05/2024 11:45 AM Kofi DELENA Rumble, MD [facility]        @DISCHMEDS @    Your medication list     ASK your doctor about these medications      Instructions Last Dose Given Next Dose Due  alendronate 10 mg tablet Commonly known as: FOSAMAX    Take ONE tablet by mouth 1 (one) time each day.     aspirin 81 mg tablet    Take ONE tablet by mouth 1 (one) time each day.     atorvastatin 40 mg tablet Commonly known as: LIPITOR    Take 1 tablet by mouth every night.     carbidopa -levodopa  25-100 mg per tablet Commonly known as: SINEMET     Take TWO tablets by mouth 3 (three) times a day. 8:00 am, noon, 4 pm     carbidopa -levodopa  25-100 mg per CR tablet Commonly known as: SINEMET  CR    Take ONE tablet by mouth every night. Do not crush, chew, or split.  1 of 2     cholecalciferol 125 mcg (5,000 unit) tablet Commonly known as: VITAMIN D -3    Take ONE tablet by mouth 1 (one) time each day.     donepeziL  5 mg tablet Commonly known as: ARICEPT     Take ONE  tablet by mouth every night. At 8 pm     escitalopram  oxalate 20 mg tablet Commonly known as: LEXAPRO     Take ONE tablet by mouth 1 (one) time each day in the evening. 4 pm     esomeprazole magnesium 40 mg DR capsule Commonly known as: NexIUM    Take ONE capsule by mouth 1 (one) time each day before breakfast. Do not open capsule.     fludrocortisone 0.1 mg tablet Commonly known as: FLORINEF    Take 1 tablet by mouth 1 time a day.     midodrine  10 mg tablet Commonly known as: PROAMATINE     Take 1 tablet by mouth 3 times a day.          Dietary Orders (From admission, onward)     Start     Ordered   08/29/24 2310  Diet Regular  Diet effective now       Question:  Diet type  Answer:  Regular   08/29/24 2306              Discharge Instructions -Please follow-up with your primary care physician in next 1 to 2 weeks.   -Please make sure to call the office number of  your primary care physician and the specialty physician to make sure that you have an  appointment for follow-up. -Take all of your medications as advised in the discharge instructions. -Report to your primary care physician or visit to the emergency department if you develop any adverse effects after taking any medications. -Monitor your blood pressure if you have high blood pressure, monitor blood sugar if you have diabetes. -Report to your primary care doctor or visit emergency department or call 911 if you develop chest pain, shortness of breath, persistent vomiting, abdominal pain, fever, weakness of arms or legs, skin rash, syncope.    Total time spent on discharge: 33 minutes . This includes time spent before the visit reviewing the chart , time spent during the visit , and time spent after the visit on documentation. All questions were answered .

## 2024-09-02 NOTE — H&P (Signed)
 Consult: Consults  History Of Present Illness: Admission Date: 08/29/2024      Admission Diagnosis:  Thyroid nodule [E04.1] Left leg weakness [R29.898] Left arm weakness [R29.898] Stroke-like symptoms [R29.90] Injury of head, initial encounter [S09.90XA] Fall, initial encounter [W19.XXXA] Discharge Date: 09/02/2024   Problem List: Principal Problem  Syncope likely orthostatic /vasovagal    Active Problems Parkinson's disease  GERD continue pantoprazole Anxiety Depression  Hyperlipidemia Dementia      Discharge Diagnosis Syncope likely orthostatic /vasovagal      Home Health needed - N/A   Disposition - [facility]PR   Patient Condition: stable    Hospital Course:   Hospital course  78 y.o. female presenting with syncopal episode while at the gym.  Patient has known history of Parkinson's disease and was hit in the boxing bag and suddenly became distended black patient reports that prior to this she was very diaphoretic sweaty and hot.  Patient apparently fell backwards hitting the head and also the right side of the body and hip patient was brought to the ER for further evaluation in the ER evaluation revealed evidence of left-sided weakness both upper and lower extremities there was painful restricted range of motion of the right hip and lower extremities due to pain because of the left-sided weakness stroke protocol was initiated.  CT head and also CT angio of the head and neck was all negative.  Patient was evaluated by teleneurology recommendation the patient be admitted for stroke workup.  Patient is awake and alert following commands and moving all extremities.  Patient also noted to have elevated blood pressures though patient has history of orthostatic hypotension on midodrine  and fludrocortisone at home.Patient should IV fluids, blood pressures have been normal/elevated at baseline on laying down.  However still significant orthostatic vitals despite IV fluids.  Neurology and  cardiology were consulted, transthoracic echo done, no acute findings.  MRI done, no stroke noted. Her symptoms are likely from orthostatic vitals from her Parkinson's disease from autonomic dysfunction.  She has been started on compression stocking and abdominal binder when she is up. Pt seen by PT and OT and recommended IPR and been accepted by PM& R   Assessment/Plan: Principal Problem  Syncope likley orthostatic/vasovagal Pt with prior history of Orthostatic Hypotension  Acute stroke has been ruled out Likely orthostatic hypotension, autonomic dysfunction in setting of Parkinson's disease Trauma workup in the ED was negative which included CT scan head, CT angio head and neck, x-ray hip, CT abdomen and pelvis MRI brain, no acute infarct, chronic small vessel ischemic changes Continue Aspirin, Plavix, statin Neurology consulted, follow recommendation, likely vasovagal, orthostatic syncopal episode Cardiology consulted, recommended likely vasovagal or orthostatic and recommended to f.up with her out pt cardiology for out pt event monitor and no further w.up recommended  Given her blood pressure previous provider did not resume her home dose of midodrine  or Florinef.  Discussed with the daughter at length and her outpatient providers recommend midodrine  10 mg 3 times daily and Florinef that will help with her orthostatic symptoms to help work with physical therapy.  Medications updated. TED hose and abdominal binder when patient is up Fall precautions PMR consulted, accepted to IPR   Active Problems Parkinson's disease  Continue Parkinson's home meds, Sinemet  3 times a day along with long-acting Sinemet  in the evening   GERD continue pantoprazole   Anxiety, depression continue Lexapro    Hyperlipidemia - continue statin   Dementia - continue Donepezil  , supportive care  PATIENT WAS SEEN BY PMR AND FELT TO REQUIRE ACUTE INPATIENT REHAB.  THE PATIENT HAS BEEN ADMITTED FOR THIS    Past Medical History: She has a past medical history of GERD (gastroesophageal reflux disease), Hypertension, Parkinson's disease, and SVT (supraventricular tachycardia) (CMS/HCC).  Surgical History: She has a past surgical history that includes Hysterectomy (12/26/1979); Total knee arthroplasty; and Knee arthroscopy w/ meniscal repair.   Social History: She reports that she has never smoked. She has never used smokeless tobacco. She reports that she does not drink alcohol and does not use drugs. Her family history includes BRAIN TUMOR in her father; Heart attack in her mother; Stroke in her mother.   Allergies: Metoclopramide hcl, Amoxicillin, Reglan [metoclopramide hcl], and Amoxicillin  Medications: Medications Prior to Admission  Medication Sig Dispense Refill Last Dose/Taking  . alendronate (FOSAMAX) 10 mg tablet Take ONE tablet by mouth 1 (one) time each day.     SABRA aspirin 81 mg tablet Take ONE tablet by mouth 1 (one) time each day.     SABRA atorvastatin (LIPITOR) 40 mg tablet Take 1 tablet by mouth every night. 30 tablet 0   . carbidopa -levodopa  (SINEMET  CR) 25-100 mg per CR tablet Take ONE tablet by mouth every night. Do not crush, chew, or split.  1 of 2     . carbidopa -levodopa  (SINEMET ) 25-100 mg per tablet Take TWO tablets by mouth 3 (three) times a day. 8:00 am, noon, 4 pm     . cholecalciferol (VITAMIN D -3) 125 mcg (5,000 unit) tablet Take ONE tablet by mouth 1 (one) time each day.     . donepeziL  (ARICEPT ) 5 mg tablet Take ONE tablet by mouth every night. At 8 pm     . escitalopram  oxalate (LEXAPRO ) 20 mg tablet Take ONE tablet by mouth 1 (one) time each day in the evening. 4 pm     . esomeprazole magnesium (NexIUM) 40 mg DR capsule Take ONE capsule by mouth 1 (one) time each day before breakfast. Do not open capsule.     . fludrocortisone (FLORINEF) 0.1 mg tablet Take 1 tablet by mouth 1 time a day. 30 tablet 1   . midodrine  (PROAMATINE ) 10 mg tablet Take 1 tablet by mouth 3  times a day. 90 tablet 1    Review of Systems  Constitutional:  Positive for fatigue.  HENT: Negative.    Eyes: Negative.   Respiratory: Negative.    Endocrine: Negative.   Genitourinary: Negative.   Musculoskeletal:  Positive for gait problem.  Neurological:  Positive for dizziness, weakness and light-headedness.  Psychiatric/Behavioral:  Positive for confusion.     Last Recorded Vitals: Blood pressure (!) 150/100, temperature 36.3 C (97.3 F), temperature source Oral, resp. rate 18, SpO2 100%.   Physical Exam Vitals and nursing note reviewed.  Constitutional:      General: She is not in acute distress.    Appearance: She is well-developed. She is not ill-appearing.  HENT:     Head: Normocephalic and atraumatic.     Mouth/Throat:     Mouth: Mucous membranes are moist.     Pharynx: Oropharynx is clear.  Eyes:     Extraocular Movements: Extraocular movements intact.     Conjunctiva/sclera: Conjunctivae normal.  Cardiovascular:     Rate and Rhythm: Normal rate and regular rhythm.     Pulses: Normal pulses.  Pulmonary:     Effort: Pulmonary effort is normal. No respiratory distress.  Abdominal:     General: There is no distension.  Musculoskeletal:        General: No deformity.     Cervical back: Normal range of motion.  Skin:    General: Skin is warm.  Neurological:     Mental Status: She is alert. Mental status is at baseline.     Motor: Weakness present.     Coordination: Coordination abnormal.  Psychiatric:        Behavior: Behavior normal.    I reviewed the patient's outside records.   Relevant Results: I reviewed lab results and outside records for this visit and discussed relevant results with patient and/or family. No results found for this or any previous visit (from the past 24 hours).    Assessment/Plan: Principal Problem:   Parkinson disease (CMS/HCC)      Assessment/Plan:  Active Problems:   Expressive aphasia      Assessment/Plan:     Hypertension      Assessment/Plan:    CKD (chronic kidney disease)      Assessment/Plan:    Parkinson disease, symptomatic (CMS/HCC)      Assessment/Plan:    Fall, initial encounter      Assessment/Plan:    Esophageal reflux      Assessment/Plan:    Plan of Treatment:  The patient is being admitted to acute intensive inpatient rehabilitation for a comprehensive course of rehabilitation to include the following: PT 2 hours a day 5 days a week for strengthening, gait training, transfer training, and mobility training OT 1-2 hours daily for ADL retraining Rehabilitation nursing for bowel and bladder management as well as promote functional improvement on the nursing floor.  Labs on admission: CBC, CMP,  will be obtained in the am.   Labs will be monitored and treated accordingly throughout the course of rehab.   Will continue DVT prophylaxis with TEDS, SCD's LOVENOX and progressive  mobility Daily rounds by physical medicine rehabilitation.   Individualized plan of care will be formed. Weekly interdisciplinary team conference will be held. Estimated length of stay is 14 days to allow the patient to improve her functional mobility and self-care skills to MOD I to I level with a walker and return home. Prognosis to Reach Goals good: Rehab potential is good and Risk of complications during acute rehabilitation stay is moderate  We anticipate continuation of outpatient PT at the time of discharge will be required. The treatment and plan of care has been explained to the patient who agrees with admission to rehab Relevant changes since preadmission review are recorded in the history and physical exam or otherwise are not relevant.

## 2024-09-14 ENCOUNTER — Other Ambulatory Visit: Payer: Self-pay | Admitting: Neurology

## 2024-09-14 DIAGNOSIS — F330 Major depressive disorder, recurrent, mild: Secondary | ICD-10-CM

## 2024-09-15 NOTE — Telephone Encounter (Signed)
 Patient cancelled appointment for tomorrow and is rescheduled for January. I did notice that patient has an upcoming appointment with Neurology [facility]

## 2024-09-16 ENCOUNTER — Ambulatory Visit: Admitting: Neurology

## 2024-10-01 ENCOUNTER — Other Ambulatory Visit: Payer: Self-pay | Admitting: Neurology

## 2024-10-01 DIAGNOSIS — G20B1 Parkinson's disease with dyskinesia, without mention of fluctuations: Secondary | ICD-10-CM

## 2024-12-12 ENCOUNTER — Other Ambulatory Visit: Payer: Self-pay | Admitting: Neurology

## 2024-12-12 DIAGNOSIS — G20B1 Parkinson's disease with dyskinesia, without mention of fluctuations: Secondary | ICD-10-CM

## 2024-12-15 ENCOUNTER — Other Ambulatory Visit: Payer: Self-pay | Admitting: Neurology

## 2024-12-15 DIAGNOSIS — F330 Major depressive disorder, recurrent, mild: Secondary | ICD-10-CM

## 2024-12-15 DIAGNOSIS — G20A1 Parkinson's disease without dyskinesia, without mention of fluctuations: Secondary | ICD-10-CM

## 2025-01-15 ENCOUNTER — Ambulatory Visit: Admitting: Neurology
